# Patient Record
Sex: Female | Born: 1970 | Race: Black or African American | Hispanic: No | Marital: Single | Smoking: Former smoker
Health system: Southern US, Community
[De-identification: ages and names within clinical notes are randomized; demographics above are authoritative.]

## PROBLEM LIST (undated history)

## (undated) DIAGNOSIS — M1990 Unspecified osteoarthritis, unspecified site: Secondary | ICD-10-CM

## (undated) DIAGNOSIS — S0990XA Unspecified injury of head, initial encounter: Secondary | ICD-10-CM

## (undated) DIAGNOSIS — I639 Cerebral infarction, unspecified: Secondary | ICD-10-CM

## (undated) DIAGNOSIS — N946 Dysmenorrhea, unspecified: Secondary | ICD-10-CM

## (undated) DIAGNOSIS — N809 Endometriosis, unspecified: Secondary | ICD-10-CM

## (undated) DIAGNOSIS — K5900 Constipation, unspecified: Secondary | ICD-10-CM

## (undated) DIAGNOSIS — Z90710 Acquired absence of both cervix and uterus: Secondary | ICD-10-CM

## (undated) DIAGNOSIS — F419 Anxiety disorder, unspecified: Secondary | ICD-10-CM

## (undated) DIAGNOSIS — F32A Depression, unspecified: Secondary | ICD-10-CM

## (undated) DIAGNOSIS — F329 Major depressive disorder, single episode, unspecified: Secondary | ICD-10-CM

## (undated) DIAGNOSIS — T7840XA Allergy, unspecified, initial encounter: Secondary | ICD-10-CM

## (undated) DIAGNOSIS — W3400XA Accidental discharge from unspecified firearms or gun, initial encounter: Secondary | ICD-10-CM

## (undated) HISTORY — DX: Unspecified injury of head, initial encounter: S09.90XA

## (undated) HISTORY — PX: NECK SURGERY: SHX720

## (undated) HISTORY — PX: TUBAL LIGATION: SHX77

## (undated) HISTORY — DX: Constipation, unspecified: K59.00

## (undated) HISTORY — DX: Anxiety disorder, unspecified: F41.9

## (undated) HISTORY — DX: Major depressive disorder, single episode, unspecified: F32.9

## (undated) HISTORY — DX: Allergy, unspecified, initial encounter: T78.40XA

## (undated) HISTORY — DX: Depression, unspecified: F32.A

## (undated) HISTORY — PX: KNEE SURGERY: SHX244

---

## 1988-10-05 HISTORY — PX: LIPOMA EXCISION: SHX5283

## 1998-02-28 ENCOUNTER — Other Ambulatory Visit: Admission: RE | Admit: 1998-02-28 | Discharge: 1998-02-28 | Payer: Self-pay | Admitting: *Deleted

## 1998-08-22 ENCOUNTER — Emergency Department (HOSPITAL_COMMUNITY): Admission: EM | Admit: 1998-08-22 | Discharge: 1998-08-22 | Payer: Self-pay

## 1999-04-13 ENCOUNTER — Emergency Department (HOSPITAL_COMMUNITY): Admission: EM | Admit: 1999-04-13 | Discharge: 1999-04-13 | Payer: Self-pay | Admitting: *Deleted

## 2000-04-22 ENCOUNTER — Emergency Department (HOSPITAL_COMMUNITY): Admission: EM | Admit: 2000-04-22 | Discharge: 2000-04-22 | Payer: Self-pay | Admitting: *Deleted

## 2001-10-05 DIAGNOSIS — I639 Cerebral infarction, unspecified: Secondary | ICD-10-CM | POA: Insufficient documentation

## 2001-10-05 DIAGNOSIS — Z5189 Encounter for other specified aftercare: Secondary | ICD-10-CM

## 2001-10-05 DIAGNOSIS — W3400XA Accidental discharge from unspecified firearms or gun, initial encounter: Secondary | ICD-10-CM | POA: Insufficient documentation

## 2001-10-05 HISTORY — DX: Encounter for other specified aftercare: Z51.89

## 2001-10-05 HISTORY — DX: Cerebral infarction, unspecified: I63.9

## 2002-02-25 ENCOUNTER — Encounter: Payer: Self-pay | Admitting: Emergency Medicine

## 2002-02-25 ENCOUNTER — Emergency Department (HOSPITAL_COMMUNITY): Admission: EM | Admit: 2002-02-25 | Discharge: 2002-02-25 | Payer: Self-pay | Admitting: Emergency Medicine

## 2002-02-28 ENCOUNTER — Encounter: Admission: RE | Admit: 2002-02-28 | Discharge: 2002-05-29 | Payer: Self-pay | Admitting: *Deleted

## 2002-05-30 ENCOUNTER — Encounter: Admission: RE | Admit: 2002-05-30 | Discharge: 2002-08-28 | Payer: Self-pay | Admitting: *Deleted

## 2002-10-18 ENCOUNTER — Encounter: Admission: RE | Admit: 2002-10-18 | Discharge: 2002-11-30 | Payer: Self-pay | Admitting: Internal Medicine

## 2003-04-27 ENCOUNTER — Encounter: Payer: Self-pay | Admitting: Internal Medicine

## 2003-04-27 ENCOUNTER — Encounter: Admission: RE | Admit: 2003-04-27 | Discharge: 2003-04-27 | Payer: Self-pay | Admitting: Internal Medicine

## 2004-05-05 ENCOUNTER — Emergency Department (HOSPITAL_COMMUNITY): Admission: EM | Admit: 2004-05-05 | Discharge: 2004-05-05 | Payer: Self-pay | Admitting: Emergency Medicine

## 2006-12-25 ENCOUNTER — Emergency Department (HOSPITAL_COMMUNITY): Admission: EM | Admit: 2006-12-25 | Discharge: 2006-12-25 | Payer: Self-pay | Admitting: Emergency Medicine

## 2006-12-30 ENCOUNTER — Emergency Department (HOSPITAL_COMMUNITY): Admission: EM | Admit: 2006-12-30 | Discharge: 2006-12-30 | Payer: Self-pay | Admitting: Emergency Medicine

## 2007-01-25 ENCOUNTER — Emergency Department (HOSPITAL_COMMUNITY): Admission: EM | Admit: 2007-01-25 | Discharge: 2007-01-25 | Payer: Self-pay | Admitting: Emergency Medicine

## 2007-02-27 ENCOUNTER — Emergency Department (HOSPITAL_COMMUNITY): Admission: EM | Admit: 2007-02-27 | Discharge: 2007-02-27 | Payer: Self-pay | Admitting: Emergency Medicine

## 2007-03-28 ENCOUNTER — Emergency Department (HOSPITAL_COMMUNITY): Admission: EM | Admit: 2007-03-28 | Discharge: 2007-03-28 | Payer: Self-pay | Admitting: Emergency Medicine

## 2007-08-04 ENCOUNTER — Encounter: Admission: RE | Admit: 2007-08-04 | Discharge: 2007-08-04 | Payer: Self-pay | Admitting: Internal Medicine

## 2007-08-17 ENCOUNTER — Encounter: Payer: Self-pay | Admitting: Internal Medicine

## 2007-09-22 ENCOUNTER — Emergency Department (HOSPITAL_COMMUNITY): Admission: EM | Admit: 2007-09-22 | Discharge: 2007-09-22 | Payer: Self-pay | Admitting: Emergency Medicine

## 2007-10-10 ENCOUNTER — Encounter: Admission: RE | Admit: 2007-10-10 | Discharge: 2007-10-10 | Payer: Self-pay | Admitting: Internal Medicine

## 2007-11-11 ENCOUNTER — Ambulatory Visit: Payer: Self-pay | Admitting: Family Medicine

## 2007-11-11 DIAGNOSIS — J329 Chronic sinusitis, unspecified: Secondary | ICD-10-CM | POA: Insufficient documentation

## 2007-11-11 DIAGNOSIS — J3089 Other allergic rhinitis: Secondary | ICD-10-CM

## 2007-11-11 DIAGNOSIS — F329 Major depressive disorder, single episode, unspecified: Secondary | ICD-10-CM | POA: Insufficient documentation

## 2007-11-11 DIAGNOSIS — F411 Generalized anxiety disorder: Secondary | ICD-10-CM | POA: Insufficient documentation

## 2007-11-11 DIAGNOSIS — F32A Depression, unspecified: Secondary | ICD-10-CM | POA: Insufficient documentation

## 2007-11-11 DIAGNOSIS — J018 Other acute sinusitis: Secondary | ICD-10-CM | POA: Insufficient documentation

## 2007-11-11 DIAGNOSIS — J302 Other seasonal allergic rhinitis: Secondary | ICD-10-CM | POA: Insufficient documentation

## 2007-11-14 ENCOUNTER — Telehealth (INDEPENDENT_AMBULATORY_CARE_PROVIDER_SITE_OTHER): Payer: Self-pay | Admitting: *Deleted

## 2007-11-14 ENCOUNTER — Encounter: Payer: Self-pay | Admitting: Family Medicine

## 2008-02-14 ENCOUNTER — Emergency Department (HOSPITAL_COMMUNITY): Admission: EM | Admit: 2008-02-14 | Discharge: 2008-02-14 | Payer: Self-pay | Admitting: Emergency Medicine

## 2008-02-24 ENCOUNTER — Encounter: Admission: RE | Admit: 2008-02-24 | Discharge: 2008-04-02 | Payer: Self-pay | Admitting: Orthopedic Surgery

## 2008-03-26 ENCOUNTER — Encounter: Payer: Self-pay | Admitting: Internal Medicine

## 2008-03-26 ENCOUNTER — Encounter: Admission: RE | Admit: 2008-03-26 | Discharge: 2008-03-26 | Payer: Self-pay | Admitting: Allergy and Immunology

## 2008-05-06 ENCOUNTER — Emergency Department (HOSPITAL_COMMUNITY): Admission: EM | Admit: 2008-05-06 | Discharge: 2008-05-06 | Payer: Self-pay | Admitting: Emergency Medicine

## 2008-08-07 ENCOUNTER — Encounter: Admission: RE | Admit: 2008-08-07 | Discharge: 2008-08-07 | Payer: Self-pay | Admitting: Internal Medicine

## 2008-09-13 ENCOUNTER — Ambulatory Visit: Payer: Self-pay | Admitting: Internal Medicine

## 2008-09-13 LAB — CONVERTED CEMR LAB
Eosinophils Absolute: 0.2 10*3/uL (ref 0.0–0.7)
HCT: 37.7 % (ref 36.0–46.0)
Lymphocytes Relative: 43.8 % (ref 12.0–46.0)
MCHC: 33.7 g/dL (ref 30.0–36.0)
MCV: 85.1 fL (ref 78.0–100.0)
Monocytes Relative: 13.1 % — ABNORMAL HIGH (ref 3.0–12.0)
RBC: 4.42 M/uL (ref 3.87–5.11)

## 2008-12-04 ENCOUNTER — Ambulatory Visit: Payer: Self-pay | Admitting: Internal Medicine

## 2008-12-04 DIAGNOSIS — J310 Chronic rhinitis: Secondary | ICD-10-CM | POA: Insufficient documentation

## 2008-12-27 ENCOUNTER — Telehealth: Payer: Self-pay | Admitting: Internal Medicine

## 2009-01-03 ENCOUNTER — Ambulatory Visit: Payer: Self-pay | Admitting: Internal Medicine

## 2009-01-08 ENCOUNTER — Ambulatory Visit: Payer: Self-pay | Admitting: Internal Medicine

## 2009-01-08 ENCOUNTER — Telehealth (INDEPENDENT_AMBULATORY_CARE_PROVIDER_SITE_OTHER): Payer: Self-pay | Admitting: *Deleted

## 2009-01-10 ENCOUNTER — Ambulatory Visit: Payer: Self-pay | Admitting: Internal Medicine

## 2009-01-14 ENCOUNTER — Ambulatory Visit: Payer: Self-pay | Admitting: Internal Medicine

## 2009-01-14 ENCOUNTER — Telehealth (INDEPENDENT_AMBULATORY_CARE_PROVIDER_SITE_OTHER): Payer: Self-pay | Admitting: *Deleted

## 2009-01-17 ENCOUNTER — Ambulatory Visit: Payer: Self-pay | Admitting: Internal Medicine

## 2009-01-21 ENCOUNTER — Ambulatory Visit: Payer: Self-pay | Admitting: Internal Medicine

## 2009-01-24 ENCOUNTER — Ambulatory Visit: Payer: Self-pay | Admitting: Internal Medicine

## 2009-01-29 ENCOUNTER — Ambulatory Visit: Payer: Self-pay | Admitting: Internal Medicine

## 2009-02-01 ENCOUNTER — Ambulatory Visit: Payer: Self-pay | Admitting: Internal Medicine

## 2009-02-04 ENCOUNTER — Ambulatory Visit: Payer: Self-pay | Admitting: Internal Medicine

## 2009-02-06 ENCOUNTER — Ambulatory Visit: Payer: Self-pay | Admitting: Internal Medicine

## 2009-02-08 ENCOUNTER — Ambulatory Visit: Payer: Self-pay | Admitting: Internal Medicine

## 2009-02-12 ENCOUNTER — Ambulatory Visit: Payer: Self-pay | Admitting: Internal Medicine

## 2009-02-25 ENCOUNTER — Ambulatory Visit: Payer: Self-pay | Admitting: Internal Medicine

## 2009-03-05 ENCOUNTER — Ambulatory Visit: Payer: Self-pay | Admitting: Internal Medicine

## 2009-03-08 ENCOUNTER — Ambulatory Visit: Payer: Self-pay | Admitting: Internal Medicine

## 2009-03-11 ENCOUNTER — Ambulatory Visit: Payer: Self-pay | Admitting: Internal Medicine

## 2009-03-12 ENCOUNTER — Ambulatory Visit: Payer: Self-pay | Admitting: Internal Medicine

## 2009-03-15 ENCOUNTER — Ambulatory Visit: Payer: Self-pay | Admitting: Internal Medicine

## 2009-03-22 ENCOUNTER — Ambulatory Visit: Payer: Self-pay | Admitting: Internal Medicine

## 2009-03-27 ENCOUNTER — Ambulatory Visit: Payer: Self-pay | Admitting: Internal Medicine

## 2009-04-01 ENCOUNTER — Ambulatory Visit: Payer: Self-pay | Admitting: Internal Medicine

## 2009-04-04 ENCOUNTER — Ambulatory Visit: Payer: Self-pay | Admitting: Internal Medicine

## 2009-04-05 ENCOUNTER — Ambulatory Visit: Payer: Self-pay | Admitting: Internal Medicine

## 2009-04-12 ENCOUNTER — Ambulatory Visit: Payer: Self-pay | Admitting: Internal Medicine

## 2009-04-17 ENCOUNTER — Telehealth (INDEPENDENT_AMBULATORY_CARE_PROVIDER_SITE_OTHER): Payer: Self-pay

## 2009-04-17 ENCOUNTER — Ambulatory Visit: Payer: Self-pay | Admitting: Internal Medicine

## 2009-04-19 ENCOUNTER — Ambulatory Visit: Payer: Self-pay | Admitting: Internal Medicine

## 2009-04-23 ENCOUNTER — Ambulatory Visit: Payer: Self-pay | Admitting: Internal Medicine

## 2009-04-26 ENCOUNTER — Ambulatory Visit: Payer: Self-pay | Admitting: Internal Medicine

## 2009-04-29 ENCOUNTER — Ambulatory Visit: Payer: Self-pay | Admitting: Internal Medicine

## 2009-05-01 ENCOUNTER — Telehealth: Payer: Self-pay | Admitting: Internal Medicine

## 2009-05-05 ENCOUNTER — Emergency Department (HOSPITAL_COMMUNITY): Admission: EM | Admit: 2009-05-05 | Discharge: 2009-05-05 | Payer: Self-pay | Admitting: Emergency Medicine

## 2009-05-16 ENCOUNTER — Ambulatory Visit: Payer: Self-pay | Admitting: Internal Medicine

## 2009-05-22 ENCOUNTER — Ambulatory Visit: Payer: Self-pay | Admitting: Internal Medicine

## 2009-05-28 ENCOUNTER — Ambulatory Visit: Payer: Self-pay | Admitting: Internal Medicine

## 2009-06-03 ENCOUNTER — Ambulatory Visit: Payer: Self-pay | Admitting: Internal Medicine

## 2009-06-03 DIAGNOSIS — T6391XA Toxic effect of contact with unspecified venomous animal, accidental (unintentional), initial encounter: Secondary | ICD-10-CM | POA: Insufficient documentation

## 2009-06-11 ENCOUNTER — Ambulatory Visit: Payer: Self-pay | Admitting: Internal Medicine

## 2009-06-17 ENCOUNTER — Ambulatory Visit: Payer: Self-pay | Admitting: Internal Medicine

## 2009-06-26 ENCOUNTER — Ambulatory Visit: Payer: Self-pay | Admitting: Internal Medicine

## 2009-07-02 ENCOUNTER — Ambulatory Visit: Payer: Self-pay | Admitting: Internal Medicine

## 2009-07-12 ENCOUNTER — Telehealth: Payer: Self-pay | Admitting: Internal Medicine

## 2009-07-12 ENCOUNTER — Ambulatory Visit: Payer: Self-pay | Admitting: Internal Medicine

## 2009-07-15 ENCOUNTER — Ambulatory Visit: Payer: Self-pay | Admitting: Internal Medicine

## 2009-07-28 ENCOUNTER — Emergency Department (HOSPITAL_COMMUNITY): Admission: EM | Admit: 2009-07-28 | Discharge: 2009-07-28 | Payer: Self-pay | Admitting: Emergency Medicine

## 2009-07-30 ENCOUNTER — Ambulatory Visit: Payer: Self-pay | Admitting: Internal Medicine

## 2009-08-07 ENCOUNTER — Ambulatory Visit: Payer: Self-pay | Admitting: Internal Medicine

## 2009-08-09 ENCOUNTER — Ambulatory Visit: Payer: Self-pay | Admitting: Internal Medicine

## 2009-08-09 ENCOUNTER — Telehealth: Payer: Self-pay | Admitting: Critical Care Medicine

## 2009-08-12 ENCOUNTER — Ambulatory Visit: Payer: Self-pay | Admitting: Internal Medicine

## 2009-08-20 ENCOUNTER — Ambulatory Visit: Payer: Self-pay | Admitting: Internal Medicine

## 2009-08-26 ENCOUNTER — Ambulatory Visit: Payer: Self-pay | Admitting: Internal Medicine

## 2009-08-26 ENCOUNTER — Encounter: Payer: Self-pay | Admitting: Internal Medicine

## 2009-09-04 ENCOUNTER — Ambulatory Visit: Payer: Self-pay | Admitting: Internal Medicine

## 2009-09-13 ENCOUNTER — Ambulatory Visit: Payer: Self-pay | Admitting: Internal Medicine

## 2009-09-17 ENCOUNTER — Encounter: Admission: RE | Admit: 2009-09-17 | Discharge: 2009-09-17 | Payer: Self-pay | Admitting: Internal Medicine

## 2009-09-20 ENCOUNTER — Ambulatory Visit: Payer: Self-pay | Admitting: Internal Medicine

## 2009-09-23 ENCOUNTER — Ambulatory Visit: Payer: Self-pay | Admitting: Internal Medicine

## 2009-09-30 ENCOUNTER — Ambulatory Visit: Payer: Self-pay | Admitting: Internal Medicine

## 2009-10-09 ENCOUNTER — Encounter: Admission: RE | Admit: 2009-10-09 | Discharge: 2009-10-29 | Payer: Self-pay | Admitting: Family Medicine

## 2009-10-30 ENCOUNTER — Ambulatory Visit: Payer: Self-pay | Admitting: Internal Medicine

## 2009-10-30 ENCOUNTER — Telehealth (INDEPENDENT_AMBULATORY_CARE_PROVIDER_SITE_OTHER): Payer: Self-pay | Admitting: *Deleted

## 2009-11-14 ENCOUNTER — Ambulatory Visit: Payer: Self-pay | Admitting: Internal Medicine

## 2009-11-14 ENCOUNTER — Telehealth (INDEPENDENT_AMBULATORY_CARE_PROVIDER_SITE_OTHER): Payer: Self-pay | Admitting: *Deleted

## 2009-11-25 ENCOUNTER — Ambulatory Visit: Payer: Self-pay | Admitting: Internal Medicine

## 2009-12-02 ENCOUNTER — Ambulatory Visit: Payer: Self-pay | Admitting: Internal Medicine

## 2009-12-13 ENCOUNTER — Telehealth: Payer: Self-pay | Admitting: Internal Medicine

## 2009-12-17 ENCOUNTER — Ambulatory Visit: Payer: Self-pay | Admitting: Internal Medicine

## 2009-12-19 ENCOUNTER — Ambulatory Visit: Payer: Self-pay | Admitting: Internal Medicine

## 2009-12-20 DIAGNOSIS — J209 Acute bronchitis, unspecified: Secondary | ICD-10-CM | POA: Insufficient documentation

## 2010-01-13 ENCOUNTER — Ambulatory Visit: Payer: Self-pay | Admitting: Internal Medicine

## 2010-01-27 ENCOUNTER — Ambulatory Visit: Payer: Self-pay | Admitting: Internal Medicine

## 2010-02-03 ENCOUNTER — Ambulatory Visit: Payer: Self-pay | Admitting: Internal Medicine

## 2010-02-11 ENCOUNTER — Ambulatory Visit: Payer: Self-pay | Admitting: Internal Medicine

## 2010-02-12 ENCOUNTER — Telehealth: Payer: Self-pay | Admitting: Internal Medicine

## 2010-02-27 ENCOUNTER — Ambulatory Visit: Payer: Self-pay | Admitting: Internal Medicine

## 2010-03-04 ENCOUNTER — Emergency Department (HOSPITAL_COMMUNITY): Admission: EM | Admit: 2010-03-04 | Discharge: 2010-03-04 | Payer: Self-pay | Admitting: Family Medicine

## 2010-03-06 ENCOUNTER — Emergency Department (HOSPITAL_COMMUNITY): Admission: EM | Admit: 2010-03-06 | Discharge: 2010-03-06 | Payer: Self-pay | Admitting: Family Medicine

## 2010-03-07 ENCOUNTER — Ambulatory Visit: Payer: Self-pay | Admitting: Internal Medicine

## 2010-03-07 DIAGNOSIS — R21 Rash and other nonspecific skin eruption: Secondary | ICD-10-CM

## 2010-03-31 ENCOUNTER — Ambulatory Visit: Payer: Self-pay | Admitting: Internal Medicine

## 2010-04-15 ENCOUNTER — Telehealth: Payer: Self-pay | Admitting: Internal Medicine

## 2010-05-22 ENCOUNTER — Ambulatory Visit: Payer: Self-pay | Admitting: Internal Medicine

## 2010-05-27 ENCOUNTER — Telehealth: Payer: Self-pay | Admitting: Internal Medicine

## 2010-06-11 ENCOUNTER — Telehealth: Payer: Self-pay | Admitting: Internal Medicine

## 2010-06-19 ENCOUNTER — Telehealth: Payer: Self-pay | Admitting: Internal Medicine

## 2010-06-20 ENCOUNTER — Encounter: Payer: Self-pay | Admitting: Internal Medicine

## 2010-07-07 ENCOUNTER — Telehealth: Payer: Self-pay | Admitting: Internal Medicine

## 2010-07-15 ENCOUNTER — Telehealth: Payer: Self-pay | Admitting: Internal Medicine

## 2010-07-17 ENCOUNTER — Telehealth: Payer: Self-pay | Admitting: Internal Medicine

## 2010-07-18 ENCOUNTER — Telehealth: Payer: Self-pay | Admitting: Internal Medicine

## 2010-07-18 ENCOUNTER — Ambulatory Visit: Payer: Self-pay | Admitting: Internal Medicine

## 2010-07-30 ENCOUNTER — Telehealth (INDEPENDENT_AMBULATORY_CARE_PROVIDER_SITE_OTHER): Payer: Self-pay | Admitting: *Deleted

## 2010-09-15 ENCOUNTER — Telehealth (INDEPENDENT_AMBULATORY_CARE_PROVIDER_SITE_OTHER): Payer: Self-pay | Admitting: *Deleted

## 2010-10-08 ENCOUNTER — Encounter
Admission: RE | Admit: 2010-10-08 | Discharge: 2010-10-08 | Payer: Self-pay | Source: Home / Self Care | Attending: Internal Medicine | Admitting: Internal Medicine

## 2010-10-26 ENCOUNTER — Encounter: Payer: Self-pay | Admitting: Internal Medicine

## 2010-10-26 ENCOUNTER — Encounter: Payer: Self-pay | Admitting: Family Medicine

## 2010-11-06 NOTE — Progress Notes (Signed)
Summary: sample  Phone Note Call from Patient   Caller: Patient Call For: young Summary of Call: can pt get sample of Nasacort.  She is here. Initial call taken by: Eugene Gavia,  October 30, 2009 4:12 PM  Follow-up for Phone Call        pt was given 1 sample of nasacort. Follow-up by: Eugene Gavia,  October 30, 2009 4:14 PM

## 2010-11-06 NOTE — Progress Notes (Signed)
Summary: rx request  Phone Note Call from Patient Call back at Home Phone [phone]   Caller: Patient Call For: young Summary of Call: pt requests refills for xyzal and nasocort. [facility] [address] Florentina Addison told pt to have this msg sent to triage (pt came in our office- she's gone now). call her home # first or cell afterwards [phone] Initial call taken by: Tivis Ringer, CNA,  November 14, 2009 9:01 AM  Follow-up for Phone Call        Spoke with pt and made aware that meds were refilled. Follow-up by: Vernie Murders,  November 14, 2009 9:21 AM    Prescriptions: FLUTICASONE PROPIONATE 50 MCG/ACT SUSP (FLUTICASONE PROPIONATE) 1-2 sprays in each nostril once daily  #1 x 5   Entered by:   Vernie Murders   Authorized by:   Waymon Budge MD   Signed by:   Vernie Murders on 11/14/2009   Method used:   [facility]        [facility]  [address] [phone]* (retail)       [address]       [place], [place]  [address]       Ph: [phone]       Fax: [phone]   RxID:   [id number] XYZAL 5 MG  TABS (LEVOCETIRIZINE DIHYDROCHLORIDE) 1 by mouth once daily  #30 x 5   Entered by:   Vernie Murders   Authorized by:   Waymon Budge MD   Signed by:   Vernie Murders on 11/14/2009   Method used:   [facility]        [facility]  [address] [phone]* (retail)       [address]       [place], [place]  [address]       Ph: [phone]       Fax: [phone]   RxID:   [id number]

## 2010-11-06 NOTE — Progress Notes (Signed)
Summary: prior authorization for xyzal approved x 1 year  Phone Note Call from Patient Call back at Home Phone [phone]   Caller: Patient Call For: Dacota Ruben Summary of Call: Need prior autho to have her xyzal 5mg  filled.[facility] [address] Initial call taken by: Darletta Moll,  July 15, 2010 3:41 PM  Follow-up for Phone Call        lmtcb  Philipp Deputy [facility]  July 15, 2010 5:00 PM   Spoke with pt and she states that she needs a PA on xyzal. I reviewed with her last phone note that states CY cannot do a PA because medicaid will not cover xyzal, that she needs to use OTC meds. PT states she has tried all OTC meds including loratidine and certrizine and they did not work for her. She states she called [facility] and they state since she has tried and failed OTC meds then MD can do PA. Pt gave the # [phone] to call for PA. I advised I will see what I can do.Jennifer Yancey Flemings CMA  July 16, 2010 9:31 AM   Additional Follow-up for Phone Call Additional follow up Details #1::        PT CALLED BACK RE: STATUS OF PRIOR AUTH. Tivis Ringer, CNA  July 17, 2010 9:37 AM  I called [facility]Cs at number given and xyzal  has been approved x 1 year. Pt advised. Carron Curie CMA  July 17, 2010 9:52 AM

## 2010-11-06 NOTE — Miscellaneous (Signed)
Summary: Injection Record / [facility]    Injection Record / [facility]    Imported By: Lennie Odor 06/06/2010 10:38:15  _____________________________________________________________________  External Attachment:    Type:   Image     Comment:   External Document

## 2010-11-06 NOTE — Progress Notes (Signed)
Summary: xyzal  Phone Note Call from Patient   Caller: Patient Call For: Helia Haese Reason for Call: Talk to Nurse Summary of Call: we sent rx for Xyzal over for pt.   She normally gets name brand on this med, because it works better thatn the generic.  Pharmacy stated to pt, we need to specify name brand. Initial call taken by: Eugene Gavia,  December 13, 2009 8:46 AM  Follow-up for Phone Call        rx sent with marked Brand medically necessary. Carron Curie CMA  December 13, 2009 10:20 AM     Prescriptions: XYZAL 5 MG  TABS (LEVOCETIRIZINE DIHYDROCHLORIDE) 1 by mouth once daily Brand medically necessary #30 x 5   Entered by:   Carron Curie CMA   Authorized by:   Waymon Budge MD   Signed by:   Carron Curie CMA on 12/13/2009   Method used:   [facility]        [facility]  [address] [phone]* (retail)       [address]       [place], [place]  [address]       Ph: [phone]       Fax: [phone]   RxID:   [id number]
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
## 2010-11-06 NOTE — Medication Information (Signed)
Summary: Denied/ACS Medicaid Services  Denied/ACS Medicaid Services   Imported By: Lester Arcola 07/15/2010 09:48:26  _____________________________________________________________________  External Attachment:    Type:   Image     Comment:   External Document

## 2010-11-06 NOTE — Miscellaneous (Signed)
Summary: Injection Record/[facility]  Injection Record/[facility]   Imported By: Sherian Rein 02/05/2010 14:34:38  _____________________________________________________________________  External Attachment:    Type:   Image     Comment:   External Document
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
## 2010-11-06 NOTE — Progress Notes (Signed)
Summary: sinus infection-tx with amoxcicillin  Phone Note Call from Patient Call back at Home Phone [phone]   Caller: Patient Call For: young Reason for Call: Acute Illness Summary of Call: Patient says she has a sinus infection.  Requesting antibiotic.  [facility] Initial call taken by: Lehman Prom,  July 30, 2010 9:54 AM  Follow-up for Phone Call        LMTCBx1 to get more symptoms from pt. Carron Curie CMA  July 30, 2010 10:18 AM  Pt returned call. Was disconnected before getting a good number. Zackery Barefoot CMA  July 30, 2010 12:57 PM   Additional Follow-up for Phone Call Additional follow up Details #1::        Spoke with pt.  She is c/o sinus drainage/pressure x 5 days.  She denies any cough but does have alot of throat clearing which she relates to sinus drainage.  Would like something called in.  Pls advise thanks! NKDA Additional Follow-up by: Vernie Murders,  July 30, 2010 1:43 PM    Additional Follow-up for Phone Call Additional follow up Details #2::    amoxacillin 500 mg, # 21, 1 three times a day   Follow-up by: Waymon Budge MD,  July 30, 2010 3:53 PM  Additional Follow-up for Phone Call Additional follow up Details #3:: Details for Additional Follow-up Action Taken: Spoke with pt and advised that rx for amoxcicillin was sent to pharm. Additional Follow-up by: Vernie Murders,  July 30, 2010 4:13 PM  New/Updated Medications: AMOXICILLIN 500 MG CAPS (AMOXICILLIN) 1 three times a day until gone Prescriptions: AMOXICILLIN 500 MG CAPS (AMOXICILLIN) 1 three times a day until gone  #21 x 0   Entered by:   Vernie Murders   Authorized by:   Waymon Budge MD   Signed by:   Vernie Murders on 07/30/2010   Method used:   Electronically to        [facility]  [address] [phone]* (retail)       [address]       [place], [place]  [address]       Ph: [phone] or [phone]       Fax: [phone]   RxID:    [id number]

## 2010-11-06 NOTE — Progress Notes (Signed)
Summary: XYZAL  Phone Note From Pharmacy   [facility]  [address] [phone]* Call For: YOUNG  Summary of Call: PER CALLER PT REQUEST BRAND NAME ONLY FOR XYZAL..... PT HAS MEDICAID PER CALLER PT WILL BE CALLING TO REQUEST THIS MED WHICH WILL REQUIRE PRIOR AUTH AND MUST READ THAT DR YOUNG SAYS THIS IS MEDICALLY NECESSARY..........[facility] [address]  [phone] [place] Initial call taken by: Denna Haggard, CMA,  December 13, 2009 9:14 AM  Follow-up for Phone Call        see prev phone note, rx sent. Carron Curie CMA  December 13, 2009 10:20 AM

## 2010-11-06 NOTE — Progress Notes (Signed)
Summary: nos appt  Phone Note Call from Patient   Caller: [email or web address]  Call For: young Summary of Call: Rsc nos from 9/6 to 9/30 @ 2:45p. Initial call taken by: Darletta Moll,  June 11, 2010 10:43 AM

## 2010-11-06 NOTE — Miscellaneous (Signed)
Summary: Injection Record/[facility]  Injection Record/[facility]   Imported By: Sherian Rein 02/06/2010 14:01:44  _____________________________________________________________________  External Attachment:    Type:   Image     Comment:   External Document

## 2010-11-06 NOTE — Progress Notes (Signed)
Summary: nos appt  Phone Note Call from Patient   Caller: [email or web address]  Call For: Prentice Sackrider Summary of Call: LMTCB x2 to rsc nos from 7/11. Initial call taken by: Darletta Moll,  April 15, 2010 3:16 PM
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
## 2010-11-06 NOTE — Progress Notes (Signed)
Summary: prescript fro xyzal  Phone Note From Pharmacy   Caller: Patient Call For: young [facility]  [address] [phone]* Call For: young  Summary of Call: pt need zyzal prescript called to pharmacy she is waiting Initial call taken by: Rickard Patience,  July 17, 2010 11:34 AM  Follow-up for Phone Call        rx called in. Carron Curie CMA  July 17, 2010 12:12 PM     Prescriptions: XYZAL 5 MG  TABS (LEVOCETIRIZINE DIHYDROCHLORIDE) 1 by mouth once daily  #30 x 6   Entered by:   Carron Curie CMA   Authorized by:   Waymon Budge MD   Signed by:   Carron Curie CMA on 07/17/2010   Method used:   Telephoned to ...       [facility]  [address] [id number] (retail)       [address]       [place], [place]  [address]       Ph: [phone]       Fax: [phone]   RxID:   [id number]

## 2010-11-06 NOTE — Progress Notes (Signed)
Summary: nos appt  Phone Note Call from Patient   Caller: [email or web address]  Call For: Jarry Manon Summary of Call: Rsc nos from 8/22 to 9/6 @ 9a. Initial call taken by: Darletta Moll,  May 27, 2010 11:16 AM

## 2010-11-06 NOTE — Miscellaneous (Signed)
Summary: Injection Orders / [facility]    Injection Orders / [facility]    Imported By: Lennie Odor 03/04/2010 16:14:34  _____________________________________________________________________  External Attachment:    Type:   Image     Comment:   External Document

## 2010-11-06 NOTE — Progress Notes (Signed)
Summary: sinus issues/cb  Phone Note Call from Patient Call back at [phone]   Caller: Patient Call For: young Summary of Call: pt said she has a sinus infection and is wanting [facility] Initial call taken by: Lacinda Axon,  September 15, 2010 5:01 PM  Follow-up for Phone Call        called and spoke with pt and she stated that she has a sinus infection with lots of drainage---cough at times---lots of clearing her throat---requesting abx to be sent in to her pharmacy----NKDA please advise.  [address] [address] [facility]  September 15, 2010 5:22 PM   per dr young ok for liquid per pt's request, augmentin 600mg /22ml 1 tsp three times a day x 7days [facility] [address]--pt aware med called to pharmacy Follow-up by: Philipp Deputy CMA,  September 15, 2010 6:15 PM

## 2010-11-06 NOTE — Progress Notes (Signed)
Summary: nos appt  Phone Note Call from Patient   Caller: [email or web address]  Call For: young Summary of Call: LMTCB x2 to rsc nos from 9/30. Initial call taken by: Darletta Moll,  July 07, 2010 3:50 PM

## 2010-11-06 NOTE — Progress Notes (Signed)
Summary: prescription issues  Phone Note Call from Patient Call back at Home Phone [phone]   Caller: Patient Call For: Christine Cunningham Summary of Call: Pt states she's having problems getting her xyzal from pharmacy, pls advise. Initial call taken by: Darletta Moll,  July 18, 2010 11:35 AM  Follow-up for Phone Call        [facility] Vernie Murders  July 18, 2010 1:18 PM  LMTCBx2. Carron Curie CMA  July 21, 2010 5:12 PM  Spoke to pt pahrmacy and they [id number]. Whenshe tried to get it filled at [address] the PA had not went thru yet so she went to another pharmacy. Nothing further needed.Carron Curie CMA  July 21, 2010 5:15 PM

## 2010-11-06 NOTE — Progress Notes (Signed)
Summary: Generic Xyzal not on formulary  Phone Note From Pharmacy   Call placed by: Michel Bickers CMA,  June 19, 2010 10:15 AM Call placed to: [facility] [phone] Summary of Call: PA initiated for feneric Xyzal.  Patient must first try and fail Loratadine or generic Zyrtec before this medication will be considered for approval. Please advise on alternative therapy for this patient. Initial call taken by: Michel Bickers CMA,  June 19, 2010 10:17 AM [facility]  [address] [id number] Call For: Christine Cunningham  Reason for Call: Medication not on formulary Request: Needs authorization from insurer Summary of Call: I cannot do a prior auth. She should use the loratadine or cetirizine otc anthistamines required by her insurance. Initial call taken by: Waymon Budge MD,  June 23, 2010 8:30 PM  Follow-up for Phone Call        Called, spoke with pt.  Informed her of above.  She states she has tried both of the above meds as recomended by PCP and neither worked.  Pls advise.  Crystal Jones RN  June 24, 2010 9:27 AM   per CY: she will have to stick with OTC because medicaid will not pay for others and OTC meds are just as good. Consider benadryl ot alavert. Carron Curie CMA  June 24, 2010 11:01 AM   Additional Follow-up for Phone Call Additional follow up Details #1::        Called, spoke with pt.  She was informed CY suggest she try OTC benadryl or alavert.  She stated has been on xyzal sine 2004-2005 and this is the only thing that worked.  Stated she has not tried benadryl before but "didn't like putting different stuff in my body" and wanted to know what Dr. Maple Cunningham would do if this medication did not work.  I informed pt he would like for her to try it and if it did not work she should call our office back to let us know this.  Pt then stated she might change doctors because "we didn't do anything for her" and "didn't have time to talk right now" and hung up on me.  Will forward  message back to Dr. Maple Cunningham so he is aware.   Additional Follow-up by: Gweneth Dimitri RN,  June 24, 2010 12:39 PM    Additional Follow-up for Phone Call Additional follow up Details #2::    Noted Follow-up by: Waymon Budge MD,  June 25, 2010 7:52 PM

## 2010-11-06 NOTE — Progress Notes (Signed)
Summary: rx-XYZAL  Phone Note From Pharmacy Call back at [phone] -Jessica   [facility]  [address] [id number] Call For: young  Summary of Call: pt's rx fro Xyzal - needs to be written on rx that brand name medically necessary. Initial call taken by: Eugene Gavia,  December 13, 2009 11:19 AM  Follow-up for Phone Call        I sent rx electronically, but pharmacy states needs to be handwritten and it needs to state "Brand Medically Necessary" written on the RX. Pt does not want generic, states name brand works better for her.  Please advise.Carron Curie CMA  December 13, 2009 12:13 PM   Additional Follow-up for Phone Call Additional follow up Details #1::        Done Additional Follow-up by: Waymon Budge MD,  December 13, 2009 1:38 PM    Additional Follow-up for Phone Call Additional follow up Details #2::    cy gave rx to [facility]. rx faxed. Carron Curie CMA  December 13, 2009 2:06 PM   New/Updated Medications: XYZAL 5 MG  TABS (LEVOCETIRIZINE DIHYDROCHLORIDE) 1 by mouth once daily

## 2010-12-19 ENCOUNTER — Telehealth (INDEPENDENT_AMBULATORY_CARE_PROVIDER_SITE_OTHER): Payer: Self-pay | Admitting: *Deleted

## 2010-12-22 LAB — CULTURE, ROUTINE-ABSCESS: Gram Stain: NONE SEEN

## 2010-12-23 NOTE — Progress Notes (Signed)
Summary: ? sinus infection--amoxicillin sent  Phone Note Call from Patient   Caller: Patient Summary of Call: Pt c/o having a sinus infection. Pt states she is having a cough w/ clear phlem and states it is a lot of phlem coming up, sinus pressure, runny nose, and feels very congested x 1 week. Pt requesting [facility] [address]. Pt states she has had amoxicillin in the past and it has helped. Pt also requesting rx be sent in to help her cough as well. Please Advise thanks.  Allergies No Known Drug Allergies  Carver Fila  December 19, 2010 8:51 AM   Follow-up for Phone Call        Per CDY-okay to give Amoxicillin 500mg  #21 take 1 by mouth three times a day x 7days no refills and Reccommend Delsym OTC sough syrup.Reynaldo Minium CMA  December 19, 2010 12:07 PM   called and informed pt rx was sent and CDY recs. She verbalized understadning Carver Fila  December 19, 2010 12:13 PM     New/Updated Medications: AMOXICILLIN 500 MG CAPS (AMOXICILLIN) take 1 tablet by mouth three times a day x 7 days Prescriptions: AMOXICILLIN 500 MG CAPS (AMOXICILLIN) take 1 tablet by mouth three times a day x 7 days  #21 x 0   Entered by:   Carver Fila   Authorized by:   Waymon Budge MD   Signed by:   Carver Fila on 12/19/2010   Method used:   [facility]ronically to        [facility]  [address] [phone][facility] (retail)       [address]       [place], [place]  [address]       Ph: [phone] or [phone]       Fax: [phone]   RxID:   [id number]

## 2010-12-29 ENCOUNTER — Ambulatory Visit: Payer: Self-pay

## 2011-01-01 ENCOUNTER — Encounter: Payer: Self-pay | Admitting: *Deleted

## 2011-01-05 ENCOUNTER — Encounter: Payer: Self-pay | Admitting: Internal Medicine

## 2011-01-05 ENCOUNTER — Ambulatory Visit (INDEPENDENT_AMBULATORY_CARE_PROVIDER_SITE_OTHER): Payer: Medicaid Other | Admitting: Internal Medicine

## 2011-01-05 VITALS — BP 124/80 | HR 78 | Ht 65.0 in | Wt 172.8 lb

## 2011-01-05 DIAGNOSIS — R21 Rash and other nonspecific skin eruption: Secondary | ICD-10-CM

## 2011-01-05 DIAGNOSIS — S0990XA Unspecified injury of head, initial encounter: Secondary | ICD-10-CM

## 2011-01-05 DIAGNOSIS — J309 Allergic rhinitis, unspecified: Secondary | ICD-10-CM

## 2011-01-05 MED ORDER — MOMETASONE FUROATE 50 MCG/ACT NA SUSP: 2.0000 | Freq: Every day | NASAL | Status: DC

## 2011-01-05 NOTE — Assessment & Plan Note (Addendum)
She can try nasonex while continuing Xyzal. Discussed environmental dust and pollen precautions with print info.  She asked about restarting allergy vaccine- told to stick to basic meds for now.
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
## 2011-01-05 NOTE — Patient Instructions (Signed)
Script for nasonex nose spray- use this every night at bedtime and expect it to take 4-5 days to kick in. Continue it through the pollen seasons.  You can try an otc antihistamine like fexofenadine 180 for sneeze, drainage and itching.

## 2011-01-10 LAB — URINALYSIS, ROUTINE W REFLEX MICROSCOPIC
Glucose, UA: NEGATIVE mg/dL
Hgb urine dipstick: NEGATIVE
Protein, ur: NEGATIVE mg/dL
Urobilinogen, UA: 0.2 mg/dL (ref 0.0–1.0)
pH: 6.5 (ref 5.0–8.0)

## 2011-01-10 LAB — POCT PREGNANCY, URINE: Preg Test, Ur: NEGATIVE

## 2011-01-20 ENCOUNTER — Telehealth: Payer: Self-pay | Admitting: Internal Medicine

## 2011-01-20 MED ORDER — AZELASTINE HCL 0.15 % NA SOLN: NASAL | Status: DC

## 2011-01-20 NOTE — Telephone Encounter (Signed)
While on the phone with pt in ref to her daughter Kerrie Buffalo, pt c/o of sneezing and runny nose and requesting CDY recs on same. Please advise. Thanks. [facility]  NKDA

## 2011-01-20 NOTE — Telephone Encounter (Signed)
Pt aware of RX for Astepro. RX sent to her pharmacy.

## 2011-01-20 NOTE — Telephone Encounter (Signed)
Spoke with CDY-rec Astepro nasal spray.Christine Cunningham

## 2011-01-20 NOTE — Telephone Encounter (Signed)
Pt has been told previously to use Claritin OTC to help with increased pollen at this time; she also has nasal sprays at home she can use.

## 2011-01-20 NOTE — Telephone Encounter (Signed)
lmomtcb x1 

## 2011-02-10 ENCOUNTER — Telehealth: Payer: Self-pay | Admitting: Internal Medicine

## 2011-02-10 NOTE — Telephone Encounter (Signed)
Already taken care of per pt.  

## 2011-02-11 ENCOUNTER — Emergency Department (HOSPITAL_COMMUNITY)
Admission: EM | Admit: 2011-02-11 | Discharge: 2011-02-11 | Disposition: A | Discharge status: Home / Self Care | Payer: Medicaid Other | Attending: Emergency Medicine | Admitting: Emergency Medicine

## 2011-02-11 ENCOUNTER — Emergency Department (HOSPITAL_COMMUNITY): Payer: Medicaid Other

## 2011-02-11 DIAGNOSIS — Y9302 Activity, running: Secondary | ICD-10-CM | POA: Insufficient documentation

## 2011-02-11 DIAGNOSIS — Z8673 Personal history of transient ischemic attack (TIA), and cerebral infarction without residual deficits: Secondary | ICD-10-CM | POA: Insufficient documentation

## 2011-02-11 DIAGNOSIS — X500XXA Overexertion from strenuous movement or load, initial encounter: Secondary | ICD-10-CM | POA: Insufficient documentation

## 2011-02-11 DIAGNOSIS — S82899A Other fracture of unspecified lower leg, initial encounter for closed fracture: Secondary | ICD-10-CM | POA: Insufficient documentation

## 2011-02-11 DIAGNOSIS — M25579 Pain in unspecified ankle and joints of unspecified foot: Secondary | ICD-10-CM | POA: Insufficient documentation

## 2011-02-20 NOTE — Assessment & Plan Note (Signed)
[facility]                                 ON-CALL NOTE   [name], [name]                         MRN:          [id number]  DATE:11/13/2007                            DOB:          [birth year]    TELEPHONE TRIAGE NOTE:  Time:  8:40 a.m.  Caller:  Christine Cunningham  Phone:  [phone]   She sees Dr. Laury Axon.   Patient has a medication question.  She has been on lorazepam for a  while and Dr. Laury Axon wanted her to start Cymbalta.  She wonders how she  should slowly come off of lorazepam while starting Cymbalta.  She has  not started Cymbalta yet.  My advice is to continue her current  medications until tomorrow.  She should ask this question to Dr. Laury Axon  herself for further advice.     Tera Mater. Clent Ridges, MD  Electronically Signed    SAF/MedQ  DD: 11/13/2007  DT: 11/14/2007  Job #: [id number]

## 2011-02-26 ENCOUNTER — Ambulatory Visit: Payer: Medicaid Other | Attending: Family Medicine | Admitting: Rehabilitative and Restorative Service Providers"

## 2011-02-26 DIAGNOSIS — M25579 Pain in unspecified ankle and joints of unspecified foot: Secondary | ICD-10-CM | POA: Insufficient documentation

## 2011-02-26 DIAGNOSIS — IMO0001 Reserved for inherently not codable concepts without codable children: Secondary | ICD-10-CM | POA: Insufficient documentation

## 2011-02-27 ENCOUNTER — Ambulatory Visit: Payer: Medicaid Other | Admitting: Rehabilitative and Restorative Service Providers"

## 2011-03-10 ENCOUNTER — Ambulatory Visit: Payer: Medicaid Other | Attending: Family Medicine | Admitting: Rehabilitative and Restorative Service Providers"

## 2011-03-10 DIAGNOSIS — M25579 Pain in unspecified ankle and joints of unspecified foot: Secondary | ICD-10-CM | POA: Insufficient documentation

## 2011-03-10 DIAGNOSIS — IMO0001 Reserved for inherently not codable concepts without codable children: Secondary | ICD-10-CM | POA: Insufficient documentation

## 2011-03-12 ENCOUNTER — Encounter: Payer: No Typology Code available for payment source | Admitting: Rehabilitative and Restorative Service Providers"

## 2011-03-17 ENCOUNTER — Ambulatory Visit: Payer: Medicaid Other | Admitting: Rehabilitative and Restorative Service Providers"

## 2011-03-19 ENCOUNTER — Ambulatory Visit: Payer: Medicaid Other | Admitting: Rehabilitative and Restorative Service Providers"

## 2011-03-23 ENCOUNTER — Ambulatory Visit: Payer: Medicaid Other | Admitting: Rehabilitative and Restorative Service Providers"

## 2011-03-25 ENCOUNTER — Encounter: Payer: No Typology Code available for payment source | Admitting: Rehabilitative and Restorative Service Providers"

## 2011-04-06 ENCOUNTER — Ambulatory Visit: Payer: Medicaid Other | Attending: Family Medicine | Admitting: Rehabilitative and Restorative Service Providers"

## 2011-04-06 DIAGNOSIS — M25579 Pain in unspecified ankle and joints of unspecified foot: Secondary | ICD-10-CM | POA: Insufficient documentation

## 2011-04-06 DIAGNOSIS — IMO0001 Reserved for inherently not codable concepts without codable children: Secondary | ICD-10-CM | POA: Insufficient documentation

## 2011-04-09 ENCOUNTER — Encounter: Payer: No Typology Code available for payment source | Admitting: Rehabilitative and Restorative Service Providers"

## 2011-04-14 ENCOUNTER — Ambulatory Visit: Payer: Medicaid Other | Admitting: Rehabilitative and Restorative Service Providers"

## 2011-04-20 ENCOUNTER — Ambulatory Visit: Payer: Medicaid Other | Admitting: Physical Therapy

## 2011-04-22 ENCOUNTER — Encounter: Payer: No Typology Code available for payment source | Admitting: Rehabilitative and Restorative Service Providers"

## 2011-04-28 ENCOUNTER — Encounter: Payer: No Typology Code available for payment source | Admitting: Physical Therapy

## 2011-04-28 ENCOUNTER — Ambulatory Visit: Payer: Medicaid Other | Admitting: Rehabilitative and Restorative Service Providers"

## 2011-07-14 ENCOUNTER — Telehealth: Payer: Self-pay | Admitting: Internal Medicine

## 2011-07-14 MED ORDER — CEFDINIR 300 MG PO CAPS: ORAL_CAPSULE | ORAL | Status: DC

## 2011-07-14 MED ORDER — LEVOCETIRIZINE DIHYDROCHLORIDE 5 MG PO TABS: 5.0000 mg | ORAL_TABLET | Freq: Every evening | ORAL | Status: DC

## 2011-07-14 NOTE — Telephone Encounter (Signed)
Spoke with pt and notified rxs for xyzal and cefdinir sent to pharm. Pt verbalized understanding.

## 2011-07-14 NOTE — Telephone Encounter (Signed)
I spoke with pt and she c/o nasal congestion, sinus pressure, sinus headache, PND x couple days. Pt is requesting something called in for her. Pt denies any fever, nausea, vomiting. Please advise recs for pt Dr. Maple Hudson, Thanks  No Known Allergies   Carver Fila, CMA

## 2011-07-14 NOTE — Telephone Encounter (Signed)
Per CY-okay to give Cefdinir 300mg  #14 2 daily no refills and Xyzal refill ok prn.

## 2011-07-22 LAB — URINALYSIS, ROUTINE W REFLEX MICROSCOPIC
Glucose, UA: NEGATIVE
Hgb urine dipstick: NEGATIVE
Ketones, ur: NEGATIVE
Urobilinogen, UA: 0.2
pH: 6.5

## 2011-07-22 LAB — PREGNANCY, URINE: Preg Test, Ur: NEGATIVE

## 2011-08-17 ENCOUNTER — Telehealth: Payer: Self-pay | Admitting: Internal Medicine

## 2011-08-17 DIAGNOSIS — J309 Allergic rhinitis, unspecified: Secondary | ICD-10-CM

## 2011-08-17 NOTE — Telephone Encounter (Signed)
Spoke with pt and she states needs PA for xyzal. We did receive PA request form from [facility]- ATC number provided for PA [phone] and the office was closed (i assume for the holiday)- will call back tomorrow to obtain PA. Pt ID number is [id number].

## 2011-08-18 NOTE — Telephone Encounter (Signed)
PA received and placed on CDY's cart along with this msg.

## 2011-08-18 NOTE — Telephone Encounter (Signed)
Called number provided and was redirected to call [phone]. Was told per rep that the pt's ID number is actually [id number]. I have requested that they fax [place] the form for CDY to fill out and will await fax.

## 2011-08-20 NOTE — Telephone Encounter (Signed)
PA received and form has been placed on cdy cart. Pt also is wanting to know if their is something else she can take in the meantime while we awaiting PA approval. Please advise Dr. Maple Hudson, thanks  No Known Allergies   Carver Fila, CMA

## 2011-08-21 MED ORDER — MOMETASONE FUROATE 50 MCG/ACT NA SUSP: 2.0000 | Freq: Every day | NASAL | Status: DC

## 2011-08-21 NOTE — Telephone Encounter (Signed)
I informed pt of CDY's findings and recommendations. She advised she will try Zyrtec again but has tried and failed all others. Also sent refills of Nasonex per pt's request.   Will hold in Katie's basket awaiting response of PA.

## 2011-08-21 NOTE — Telephone Encounter (Signed)
Please ask her if she remembers why she can't use loratadine, Alavert or Claritin, fexofenadine or Allegra. Zyrtec/ cetirizine is very nearly the same as Xyzal- suggest she take that as needed .

## 2011-08-21 NOTE — Telephone Encounter (Signed)
lmomtcb x1 

## 2011-08-25 NOTE — Telephone Encounter (Signed)
PA info has been faxed back and will await approval or denial; paperwork is in triage in the waiting box.

## 2011-08-31 ENCOUNTER — Telehealth: Payer: Self-pay | Admitting: Internal Medicine

## 2011-08-31 MED ORDER — LEVOCETIRIZINE DIHYDROCHLORIDE 5 MG PO TABS: 5.0000 mg | ORAL_TABLET | Freq: Every evening | ORAL | Status: DC

## 2011-08-31 NOTE — Telephone Encounter (Signed)
I called ins company and they state they did not receive that fax so I did PA over the phone and it was approved from 11--9-12 x 1 year. PT and pahrmacy aware. Carron Curie, CMA

## 2011-08-31 NOTE — Telephone Encounter (Signed)
See phone note from 08-17-11. PA completed and medication approved x 1 year from 11-912. Pt aware and new rx sent. Carron Curie, CMA

## 2011-09-10 ENCOUNTER — Other Ambulatory Visit: Payer: Self-pay | Admitting: Internal Medicine

## 2011-09-10 DIAGNOSIS — Z1231 Encounter for screening mammogram for malignant neoplasm of breast: Secondary | ICD-10-CM

## 2011-09-15 ENCOUNTER — Telehealth: Payer: Self-pay | Admitting: Internal Medicine

## 2011-09-15 NOTE — Telephone Encounter (Signed)
Per CY-decongestant like Mucinex D, saline rinse like Netipot.

## 2011-09-15 NOTE — Telephone Encounter (Signed)
Pt is aware of CDY recs and will call if these do not work for her.

## 2011-09-15 NOTE — Telephone Encounter (Signed)
Pt c/o PND, sinus pressure x 1 week. States completed abx last week. Denies cough, chest tightness, wheezing. Please advise, thanks.  No Known Allergies  [facility]

## 2011-10-12 ENCOUNTER — Ambulatory Visit: Payer: No Typology Code available for payment source

## 2011-11-12 ENCOUNTER — Ambulatory Visit: Payer: No Typology Code available for payment source | Admitting: Internal Medicine

## 2011-11-26 ENCOUNTER — Ambulatory Visit: Payer: No Typology Code available for payment source

## 2011-11-30 ENCOUNTER — Telehealth: Payer: Self-pay | Admitting: Internal Medicine

## 2011-11-30 MED ORDER — DESOXIMETASONE 0.25 % EX CREA: TOPICAL_CREAM | Freq: Two times a day (BID) | CUTANEOUS | Status: DC

## 2011-11-30 NOTE — Telephone Encounter (Signed)
I spoke with the pt and she states she had 2 raised, red bumps on her left shoulder and behind her left ear and now her left ear is swollen and both places are itching. She stated she has not tried anything OTC for this yet, she wants CY recs before she takes anything. Please advise. Carron Curie, CMA No Known Allergies

## 2011-11-30 NOTE — Telephone Encounter (Signed)
Christine Cunningham has arrived in lobby & is requesting to get a cortizone shot-she has broke out unsure why.  Pt isn't wheezing, but has broken out & stated that now her back is itching.  [name]

## 2011-11-30 NOTE — Telephone Encounter (Signed)
Per CDY can take fexofenadine and use topicort steroid cream 0.25%. Pt is aware of this bc she walked in and was informed by TD. I have sent rx for the topicort into the pharmacy. Nothing further was needed

## 2011-12-01 ENCOUNTER — Telehealth: Payer: Self-pay | Admitting: Internal Medicine

## 2011-12-01 NOTE — Telephone Encounter (Signed)
Spoke with pt. She states that the topicort cream requires PA  Called and spoke with pharmacist and she will fax [place] the PA request. Will await fax

## 2011-12-01 NOTE — Telephone Encounter (Signed)
I haven't seen this rash and won't prescribe. I did suggest she try an otc topical steroid such as hydrocortisone.

## 2011-12-01 NOTE — Telephone Encounter (Signed)
[facility]- called to initiate PA at [phone] Was advised that this med is not covered- the preferred meds are flunisolide cream or triamcinolone cream.  Please advise which one to prescribe for her, thanks! No Known Allergies

## 2011-12-01 NOTE — Telephone Encounter (Signed)
Per CY-get Hydrocortisone cream OTC nothing to RX. If no better then pt will get with PCP to have a look at skin area and refer to dermatology if needed.

## 2011-12-08 ENCOUNTER — Telehealth: Payer: Self-pay | Admitting: Pulmonary Disease

## 2011-12-08 MED ORDER — LEVOCETIRIZINE DIHYDROCHLORIDE 5 MG PO TABS: 5.0000 mg | ORAL_TABLET | Freq: Every evening | ORAL | Status: DC

## 2011-12-08 NOTE — Telephone Encounter (Signed)
Spoke with pt. She is requesting 90 day supply xyzal be sent to pharmacy. Rx was sent and I reminded her to keep upcoming appt for future refills. Pt verbalized understanding and states nothing further needed.

## 2011-12-28 ENCOUNTER — Ambulatory Visit
Admission: RE | Admit: 2011-12-28 | Discharge: 2011-12-28 | Disposition: A | Discharge status: Home / Self Care | Payer: Medicaid Other | Source: Ambulatory Visit | Attending: Internal Medicine | Admitting: Internal Medicine

## 2011-12-28 DIAGNOSIS — Z1231 Encounter for screening mammogram for malignant neoplasm of breast: Secondary | ICD-10-CM

## 2012-01-05 ENCOUNTER — Ambulatory Visit: Payer: Medicaid Other | Admitting: Internal Medicine

## 2012-02-18 ENCOUNTER — Ambulatory Visit: Payer: Medicaid Other | Admitting: Internal Medicine

## 2012-03-28 ENCOUNTER — Ambulatory Visit: Payer: Medicaid Other | Admitting: Internal Medicine

## 2012-04-01 ENCOUNTER — Encounter: Payer: Self-pay | Admitting: Internal Medicine

## 2012-04-01 ENCOUNTER — Ambulatory Visit (INDEPENDENT_AMBULATORY_CARE_PROVIDER_SITE_OTHER): Payer: Medicaid Other | Admitting: Internal Medicine

## 2012-04-01 VITALS — BP 116/72 | HR 87 | Ht 65.0 in | Wt 167.0 lb

## 2012-04-01 DIAGNOSIS — J018 Other acute sinusitis: Secondary | ICD-10-CM
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
## 2012-04-01 NOTE — Patient Instructions (Addendum)
Sample Dymista nasal spray   1- 2 puffs each nostril every night at bedtime.

## 2012-04-05 NOTE — Assessment & Plan Note (Signed)
Acute exacerbation of rhinitis/sinusitis. I doubt bacterial infection. Plan-Sample Dymista

## 2012-05-02 ENCOUNTER — Telehealth: Payer: Self-pay | Admitting: Internal Medicine

## 2012-05-02 MED ORDER — AZITHROMYCIN 250 MG PO TABS: ORAL_TABLET | ORAL | Status: DC

## 2012-05-02 NOTE — Telephone Encounter (Signed)
Per CY-okay to give Zpak #1 take as directed.

## 2012-05-02 NOTE — Telephone Encounter (Signed)
I spoke with pt and she states she is beginning to get a sinus infection-C/O sinus HA, sinus pressure, dry cough but phlem feels stuck in her throat x Saturday. Denies any f/c/s/n/v. Pt is requesting to have something called in. Please advise Dr. Maple Hudson thanks  No Known Allergies

## 2012-05-02 NOTE — Telephone Encounter (Signed)
Pt aware of CDY RECS AND rx has been sent to the pharmacy. She voiced her understanding and had no questions

## 2012-05-12 ENCOUNTER — Ambulatory Visit: Payer: Medicaid Other | Admitting: Internal Medicine

## 2012-06-02 ENCOUNTER — Telehealth: Payer: Self-pay | Admitting: Internal Medicine

## 2012-06-02 NOTE — Telephone Encounter (Signed)
ATC but call was disconnected. WCB Carron Curie, CMA

## 2012-06-03 ENCOUNTER — Telehealth: Payer: Self-pay | Admitting: Internal Medicine

## 2012-06-03 ENCOUNTER — Other Ambulatory Visit: Payer: Self-pay | Admitting: Internal Medicine

## 2012-06-03 MED ORDER — LEVOCETIRIZINE DIHYDROCHLORIDE 5 MG PO TABS: 5.0000 mg | ORAL_TABLET | Freq: Every evening | ORAL | Status: DC

## 2012-06-03 MED ORDER — AZITHROMYCIN 250 MG PO TABS: ORAL_TABLET | ORAL | Status: DC

## 2012-06-03 NOTE — Telephone Encounter (Signed)
Contacted patient. Patient last seen 6/28. Sent in refill and patient aware.

## 2012-06-03 NOTE — Telephone Encounter (Signed)
Called, spoke with pt.  Requesting rx for zyxal 5 mg and zpak.  Advised zyxal sent but would need approval for zpak from Dr. Maple Hudson.  Pt c/o sinus pressure, nasal congestion with clear mucus, PND, and coughing "off and on" with clear mucus.  Symptoms started this past weekend.  No f/c/s.  Dr. Maple Hudson, pls advise.  Thank you.  nkda - verified  [facility]

## 2012-06-03 NOTE — Telephone Encounter (Signed)
Z pak rx sent to pharm.  Pt aware.

## 2012-06-03 NOTE — Telephone Encounter (Signed)
Per CY okay to give Zpak #1 take as directed no refills. 

## 2012-06-30 DIAGNOSIS — M25571 Pain in right ankle and joints of right foot: Secondary | ICD-10-CM | POA: Insufficient documentation

## 2012-08-22 ENCOUNTER — Telehealth: Payer: Self-pay | Admitting: Internal Medicine

## 2012-08-22 MED ORDER — AMOXICILLIN 500 MG PO CAPS: 500.0000 mg | ORAL_CAPSULE | Freq: Three times a day (TID) | ORAL | Status: DC

## 2012-08-22 NOTE — Telephone Encounter (Signed)
Per CY-okay to give Amoxicillin 500 mg #21 take 1 po TID no refills.  

## 2012-08-22 NOTE — Telephone Encounter (Signed)
I spoke with pt and is aware of CDY recs. rx has been sent to the pharmacy. 

## 2012-08-22 NOTE — Telephone Encounter (Signed)
I spoke with pt and she believes she has a sinus infection. C/o sinus pressure, nasal congestion, clowing out clear phlem, HA, PND x 1 week. She has been using nasal spray. She is requesting to have something called in for her. Please advise Dr. Maple Hudson thanks  No Known Allergies   Last OV 04/01/12 Pending 04/03/13

## 2012-09-08 ENCOUNTER — Emergency Department (HOSPITAL_COMMUNITY): Payer: Medicaid Other

## 2012-09-08 ENCOUNTER — Emergency Department (HOSPITAL_COMMUNITY)
Admission: EM | Admit: 2012-09-08 | Discharge: 2012-09-08 | Disposition: A | Discharge status: Home / Self Care | Payer: Medicaid Other | Attending: Emergency Medicine | Admitting: Emergency Medicine

## 2012-09-08 DIAGNOSIS — Z79899 Other long term (current) drug therapy: Secondary | ICD-10-CM | POA: Insufficient documentation

## 2012-09-08 DIAGNOSIS — F411 Generalized anxiety disorder: Secondary | ICD-10-CM | POA: Insufficient documentation

## 2012-09-08 DIAGNOSIS — Y92009 Unspecified place in unspecified non-institutional (private) residence as the place of occurrence of the external cause: Secondary | ICD-10-CM | POA: Insufficient documentation

## 2012-09-08 DIAGNOSIS — S8000XA Contusion of unspecified knee, initial encounter: Secondary | ICD-10-CM | POA: Insufficient documentation

## 2012-09-08 DIAGNOSIS — W010XXA Fall on same level from slipping, tripping and stumbling without subsequent striking against object, initial encounter: Secondary | ICD-10-CM | POA: Insufficient documentation

## 2012-09-08 DIAGNOSIS — Z9889 Other specified postprocedural states: Secondary | ICD-10-CM | POA: Insufficient documentation

## 2012-09-08 DIAGNOSIS — Y9301 Activity, walking, marching and hiking: Secondary | ICD-10-CM | POA: Insufficient documentation

## 2012-09-08 DIAGNOSIS — S92109A Unspecified fracture of unspecified talus, initial encounter for closed fracture: Secondary | ICD-10-CM | POA: Insufficient documentation

## 2012-09-08 DIAGNOSIS — F329 Major depressive disorder, single episode, unspecified: Secondary | ICD-10-CM | POA: Insufficient documentation

## 2012-09-08 DIAGNOSIS — S93402A Sprain of unspecified ligament of left ankle, initial encounter: Secondary | ICD-10-CM

## 2012-09-08 DIAGNOSIS — S92153A Displaced avulsion fracture (chip fracture) of unspecified talus, initial encounter for closed fracture: Secondary | ICD-10-CM

## 2012-09-08 DIAGNOSIS — F3289 Other specified depressive episodes: Secondary | ICD-10-CM | POA: Insufficient documentation

## 2012-09-08 DIAGNOSIS — Z87828 Personal history of other (healed) physical injury and trauma: Secondary | ICD-10-CM | POA: Insufficient documentation

## 2012-09-08 DIAGNOSIS — S93409A Sprain of unspecified ligament of unspecified ankle, initial encounter: Secondary | ICD-10-CM | POA: Insufficient documentation

## 2012-09-08 DIAGNOSIS — S8001XA Contusion of right knee, initial encounter: Secondary | ICD-10-CM

## 2012-09-08 MED ORDER — IBUPROFEN 800 MG PO TABS
800.0000 mg | ORAL_TABLET | Freq: Once | ORAL | Status: AC
  Administered 2012-09-08: 800 mg via ORAL
  Filled 2012-09-08: qty 1

## 2012-09-08 MED ORDER — IBUPROFEN 800 MG PO TABS: 800.0000 mg | ORAL_TABLET | Freq: Once | ORAL | Status: DC

## 2012-09-08 MED ORDER — IBUPROFEN 800 MG PO TABS: 800.0000 mg | ORAL_TABLET | Freq: Four times a day (QID) | ORAL | Status: DC | PRN

## 2012-09-08 NOTE — ED Notes (Signed)
Pt states that she stepped off her steps wrong this am and has pain and swelling to lt ankle.
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
## 2012-09-13 ENCOUNTER — Telehealth: Payer: Self-pay | Admitting: Internal Medicine

## 2012-09-13 NOTE — Telephone Encounter (Signed)
[facility]ed [facility] be [facility]ed [facility].

## 2012-09-13 NOTE — ED Provider Notes (Signed)
Medical screening examination/treatment/procedure(s) were performed by non-physician practitioner and as supervising physician I was immediately available for consultation/collaboration.  Marwan T Powers, MD 09/13/12 0806 

## 2012-09-14 NOTE — Telephone Encounter (Signed)
Received PA xyxal 5 mg called [phone] [facility] medicaid # [id number] Authorization code [id number]. This is being processed. Will forward to Katie to f/u on.

## 2012-09-23 NOTE — Telephone Encounter (Signed)
Katie please advise if you have received anything on pt. thanks

## 2012-10-03 ENCOUNTER — Ambulatory Visit: Payer: Medicaid Other | Attending: Orthopaedic Surgery | Admitting: Physical Therapy

## 2012-10-03 NOTE — Telephone Encounter (Signed)
I have not gotten any approval or denial papers on this patient. I am unable to speak well today(losing voice); please call to see the decision on patient. Thanks.

## 2012-10-03 NOTE — Telephone Encounter (Signed)
Per rep at [facility], Xyzal has been APPROVED from 09/14/12 through 09/09/13. Pt and pharmacy have both been notified.

## 2012-10-17 ENCOUNTER — Telehealth: Payer: Self-pay | Admitting: Internal Medicine

## 2012-10-17 NOTE — Telephone Encounter (Signed)
lmomtcb x1 for pt 

## 2012-10-18 NOTE — Telephone Encounter (Signed)
LMTCB x 1 

## 2012-10-19 NOTE — Telephone Encounter (Signed)
LMTCBx3. Jennifer Castillo, CMA  

## 2012-10-21 MED ORDER — AZELASTINE HCL 0.15 % NA SOLN: NASAL | Status: DC

## 2012-10-21 MED ORDER — FLUTICASONE FUROATE 27.5 MCG/SPRAY NA SUSP: 2.0000 | Freq: Every day | NASAL | Status: DC

## 2012-10-21 NOTE — Telephone Encounter (Signed)
Last seen 04/01/12 with follow-up in 1 year. Pt saw Dr. Concepcion Elk on Mon., 10/17/12 and was started on Augmentin 875mg  BID x 10 days and says she is getting better. She would like refills on her nasal sprays and recs from CY for her watery eyes. CY, pls advise.No Known Allergies

## 2012-10-21 NOTE — Telephone Encounter (Signed)
Per CY-okay to refill Azelastine and Fluticasone(Veramyst)x 1 year; "ask pharmacy for bland eye ointment like lacri-lube".

## 2012-10-21 NOTE — Telephone Encounter (Signed)
Called and spoke with pt and she is aware that the 2 nasal sprays have been sent to her pharmacy and pt requested these for 90 day supply.  Pt is aware of eye ointment that CY recs.  Nothing further is needed.

## 2012-10-24 MED ORDER — FLUTICASONE PROPIONATE 50 MCG/ACT NA SUSP: 2.0000 | Freq: Every day | NASAL | Status: DC

## 2012-10-24 NOTE — Telephone Encounter (Addendum)
Insurance will not cover generic Fluticasone/Veramyst but will cover generic Fluticasone/Flonase. The Flonase is the second part of Dymista and therefore would be the correct medication. Verbally changed this with the pharmacy and updated pt's medication list. Nothing further is needed.

## 2012-10-24 NOTE — Addendum Note (Signed)
Addended by: Michel Bickers A on: 10/24/2012 03:30 PM   Modules accepted: Orders

## 2012-10-26 ENCOUNTER — Ambulatory Visit: Payer: Medicaid Other | Attending: Orthopaedic Surgery | Admitting: Rehabilitative and Restorative Service Providers"

## 2012-11-03 ENCOUNTER — Ambulatory Visit: Payer: Medicaid Other | Attending: Orthopaedic Surgery | Admitting: Physical Therapy

## 2012-11-03 DIAGNOSIS — M25676 Stiffness of unspecified foot, not elsewhere classified: Secondary | ICD-10-CM | POA: Insufficient documentation

## 2012-11-03 DIAGNOSIS — IMO0001 Reserved for inherently not codable concepts without codable children: Secondary | ICD-10-CM | POA: Insufficient documentation

## 2012-11-03 DIAGNOSIS — M25579 Pain in unspecified ankle and joints of unspecified foot: Secondary | ICD-10-CM | POA: Insufficient documentation

## 2012-11-03 DIAGNOSIS — M25673 Stiffness of unspecified ankle, not elsewhere classified: Secondary | ICD-10-CM | POA: Insufficient documentation

## 2012-11-04 ENCOUNTER — Other Ambulatory Visit: Payer: Self-pay | Admitting: Internal Medicine

## 2012-11-04 DIAGNOSIS — Z1231 Encounter for screening mammogram for malignant neoplasm of breast: Secondary | ICD-10-CM

## 2012-11-10 ENCOUNTER — Ambulatory Visit: Payer: Medicaid Other | Attending: Orthopaedic Surgery | Admitting: Physical Therapy

## 2012-11-10 DIAGNOSIS — M25673 Stiffness of unspecified ankle, not elsewhere classified: Secondary | ICD-10-CM | POA: Insufficient documentation

## 2012-11-10 DIAGNOSIS — M25676 Stiffness of unspecified foot, not elsewhere classified: Secondary | ICD-10-CM | POA: Insufficient documentation

## 2012-11-10 DIAGNOSIS — IMO0001 Reserved for inherently not codable concepts without codable children: Secondary | ICD-10-CM | POA: Insufficient documentation

## 2012-11-10 DIAGNOSIS — M25579 Pain in unspecified ankle and joints of unspecified foot: Secondary | ICD-10-CM | POA: Insufficient documentation

## 2012-11-17 ENCOUNTER — Ambulatory Visit: Payer: Medicaid Other | Admitting: Physical Therapy

## 2012-11-30 ENCOUNTER — Telehealth: Payer: Self-pay | Admitting: Internal Medicine

## 2012-11-30 MED ORDER — AMOXICILLIN-POT CLAVULANATE 875-125 MG PO TABS: 1.0000 | ORAL_TABLET | Freq: Two times a day (BID) | ORAL | Status: DC

## 2012-11-30 NOTE — Telephone Encounter (Signed)
Refills sent. Pt is aware. Jennifer Castillo, CMA  

## 2012-11-30 NOTE — Telephone Encounter (Signed)
Per CY-okay to give Augmentin 875 mg #14 take 1 po bid no refills.  

## 2012-11-30 NOTE — Telephone Encounter (Signed)
Called, spoke with pt.  Believes she has a sinus infection -- c/o head congestion, sinus pressure, and PND x 1 wk.  Denies f/c/s.  Using flonase daily.  Requesting abx.  Dr. Maple Hudson, pls advise.  Thank you.  Last OV with CDY 04/01/12; asked to f/u in 1 yr  [facility]  nkda - verified with pt

## 2012-12-27 ENCOUNTER — Telehealth: Payer: Self-pay | Admitting: Internal Medicine

## 2012-12-27 MED ORDER — AMOXICILLIN 500 MG PO CAPS: 500.0000 mg | ORAL_CAPSULE | Freq: Three times a day (TID) | ORAL | Status: DC

## 2012-12-27 MED ORDER — LEVOCETIRIZINE DIHYDROCHLORIDE 5 MG PO TABS: 5.0000 mg | ORAL_TABLET | Freq: Every day | ORAL | Status: DC

## 2012-12-27 NOTE — Telephone Encounter (Signed)
ATC patient, no answer LMOMTCB 

## 2012-12-27 NOTE — Telephone Encounter (Signed)
Returning call.Christine Cunningham ° °

## 2012-12-27 NOTE — Telephone Encounter (Signed)
Pt aware of RX sent to [facility]. Pt also requested Rx for Xyzal. Both sent.

## 2012-12-27 NOTE — Telephone Encounter (Signed)
Spoke with pt having sinus drainage,productive cough,denies any sob,wheezing fever. Would like [facility].  No Known Allergies Dr Maple Hudson Please advise . Thank you

## 2012-12-27 NOTE — Telephone Encounter (Signed)
Per CY-okay to give Amoxicillin 500 mg #21 take 1 po TID no refills.  

## 2012-12-27 NOTE — Telephone Encounter (Signed)
LMTCB

## 2012-12-28 ENCOUNTER — Ambulatory Visit
Admission: RE | Admit: 2012-12-28 | Discharge: 2012-12-28 | Disposition: A | Discharge status: Home / Self Care | Payer: Medicaid Other | Source: Ambulatory Visit | Attending: Internal Medicine | Admitting: Internal Medicine

## 2012-12-28 DIAGNOSIS — Z1231 Encounter for screening mammogram for malignant neoplasm of breast: Secondary | ICD-10-CM

## 2013-01-26 ENCOUNTER — Telehealth: Payer: Self-pay | Admitting: Internal Medicine

## 2013-01-26 NOTE — Telephone Encounter (Signed)
LMTCB

## 2013-01-26 NOTE — Telephone Encounter (Signed)
Called, spoke with pt.  C/o sneezing, itchy eyes, hoarseness, and PND x the past few days.  No wheezing, chest tightness, or cough.  Requesting rx to be called in -- hasn't tried any OTC meds.  Dr. Maple Hudson, pls advise.    nkda - verified with pt  [facility]  Last OV with CDY: 04/01/12; asked to f/u in 1 yr Pending OV with CDY 04/03/2013

## 2013-01-26 NOTE — Telephone Encounter (Signed)
Pt aware of CY rec's nothing further needed.

## 2013-01-26 NOTE — Telephone Encounter (Signed)
Per CY-Allegra 180 1 daily or Chlortrimeton OTC.

## 2013-01-27 NOTE — Telephone Encounter (Signed)
Per phone msg from 01/26/13:  Ronny Bacon, CMA at 01/26/2013  4:11 PM    Status: Signed                   Per CY-Allegra 180 1 daily or Chlortrimeton OTC.      -------  Called, spoke with pt.  Pt reports the msg was on her VM.  She is now aware of above recs per Dr. Maple Hudson.  She will try allegra 180 qd and will call back if symptoms do not improve or worsen.

## 2013-02-11 ENCOUNTER — Emergency Department (HOSPITAL_COMMUNITY): Payer: Medicaid Other

## 2013-02-11 ENCOUNTER — Encounter (HOSPITAL_COMMUNITY): Payer: Self-pay

## 2013-02-11 ENCOUNTER — Emergency Department (HOSPITAL_COMMUNITY)
Admission: EM | Admit: 2013-02-11 | Discharge: 2013-02-11 | Disposition: A | Discharge status: Home / Self Care | Payer: Medicaid Other | Attending: Emergency Medicine | Admitting: Emergency Medicine

## 2013-02-11 DIAGNOSIS — Y9389 Activity, other specified: Secondary | ICD-10-CM | POA: Insufficient documentation

## 2013-02-11 DIAGNOSIS — S63501A Unspecified sprain of right wrist, initial encounter: Secondary | ICD-10-CM

## 2013-02-11 DIAGNOSIS — S63509A Unspecified sprain of unspecified wrist, initial encounter: Secondary | ICD-10-CM | POA: Insufficient documentation

## 2013-02-11 DIAGNOSIS — Z87891 Personal history of nicotine dependence: Secondary | ICD-10-CM | POA: Insufficient documentation

## 2013-02-11 DIAGNOSIS — F3289 Other specified depressive episodes: Secondary | ICD-10-CM | POA: Insufficient documentation

## 2013-02-11 DIAGNOSIS — Z79899 Other long term (current) drug therapy: Secondary | ICD-10-CM | POA: Insufficient documentation

## 2013-02-11 DIAGNOSIS — Y92009 Unspecified place in unspecified non-institutional (private) residence as the place of occurrence of the external cause: Secondary | ICD-10-CM | POA: Insufficient documentation

## 2013-02-11 DIAGNOSIS — F411 Generalized anxiety disorder: Secondary | ICD-10-CM | POA: Insufficient documentation

## 2013-02-11 DIAGNOSIS — F329 Major depressive disorder, single episode, unspecified: Secondary | ICD-10-CM | POA: Insufficient documentation

## 2013-02-11 DIAGNOSIS — Z8739 Personal history of other diseases of the musculoskeletal system and connective tissue: Secondary | ICD-10-CM | POA: Insufficient documentation

## 2013-02-11 DIAGNOSIS — X58XXXA Exposure to other specified factors, initial encounter: Secondary | ICD-10-CM | POA: Insufficient documentation

## 2013-02-11 MED ORDER — IBUPROFEN 800 MG PO TABS
800.0000 mg | ORAL_TABLET | Freq: Once | ORAL | Status: AC
  Administered 2013-02-11: 800 mg via ORAL
  Filled 2013-02-11: qty 1

## 2013-02-11 NOTE — ED Provider Notes (Signed)
Medical screening examination/treatment/procedure(s) were performed by non-physician practitioner and as supervising physician I was immediately available for consultation/collaboration.   Camreigh Michie, MD 02/11/13 1938 
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
## 2013-02-11 NOTE — ED Notes (Signed)
C/o pain to RT wrist pain x 2 weeks.  Denies injury.  States the pain increases when she twists wrist.

## 2013-04-03 ENCOUNTER — Ambulatory Visit: Payer: Medicaid Other | Admitting: Internal Medicine

## 2013-04-17 ENCOUNTER — Encounter (HOSPITAL_COMMUNITY): Payer: Self-pay | Admitting: Emergency Medicine

## 2013-04-17 ENCOUNTER — Emergency Department (HOSPITAL_COMMUNITY)
Admission: EM | Admit: 2013-04-17 | Discharge: 2013-04-17 | Disposition: A | Discharge status: Home / Self Care | Payer: Medicaid Other | Attending: Emergency Medicine | Admitting: Emergency Medicine

## 2013-04-17 DIAGNOSIS — Z87891 Personal history of nicotine dependence: Secondary | ICD-10-CM | POA: Insufficient documentation

## 2013-04-17 DIAGNOSIS — M79609 Pain in unspecified limb: Secondary | ICD-10-CM | POA: Insufficient documentation

## 2013-04-17 DIAGNOSIS — F411 Generalized anxiety disorder: Secondary | ICD-10-CM | POA: Insufficient documentation

## 2013-04-17 DIAGNOSIS — Z87828 Personal history of other (healed) physical injury and trauma: Secondary | ICD-10-CM | POA: Insufficient documentation

## 2013-04-17 DIAGNOSIS — M79674 Pain in right toe(s): Secondary | ICD-10-CM

## 2013-04-17 DIAGNOSIS — F329 Major depressive disorder, single episode, unspecified: Secondary | ICD-10-CM | POA: Insufficient documentation

## 2013-04-17 DIAGNOSIS — F3289 Other specified depressive episodes: Secondary | ICD-10-CM | POA: Insufficient documentation

## 2013-04-17 DIAGNOSIS — Z79899 Other long term (current) drug therapy: Secondary | ICD-10-CM | POA: Insufficient documentation

## 2013-04-17 MED ORDER — IBUPROFEN 800 MG PO TABS: 800.0000 mg | ORAL_TABLET | Freq: Three times a day (TID) | ORAL | Status: DC

## 2013-04-17 NOTE — ED Notes (Signed)
Tuesday am, hit right little toe on chair, soaked in epsom salt x 1 week, states continues to be swollen, painful and stiff. States unable to wear shoes, minimal swelling noted, no discoloration
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
## 2013-04-18 NOTE — ED Provider Notes (Signed)
Medical screening examination/treatment/procedure(s) were performed by non-physician practitioner and as supervising physician I was immediately available for consultation/collaboration.  Candyce Churn, MD 04/18/13 [phone]

## 2013-05-03 ENCOUNTER — Telehealth: Payer: Self-pay | Admitting: Internal Medicine

## 2013-05-03 MED ORDER — BROMPHENIRAMINE-PHENYLEPHRINE 4-10 MG PO TB12: 1.0000 | ORAL_TABLET | Freq: Two times a day (BID) | ORAL | Status: DC | PRN

## 2013-05-03 NOTE — Telephone Encounter (Signed)
Pt aware that RX has [facility] [address]

## 2013-05-03 NOTE — Telephone Encounter (Signed)
Pt returning call.Christine Cunningham ° °

## 2013-05-03 NOTE — Telephone Encounter (Signed)
OV scheduled for 05-11-13 at 3:15pm already. Spoke with patient-head congestion, sinus drainage into throat-no color. Pt states that OTC doesn't help. Would like something called to pharmacy. States Amoxicillin doesn't work well either.   No Known Allergies   Please advise. Thanks.

## 2013-05-03 NOTE — Telephone Encounter (Signed)
Per CY give 4-10

## 2013-05-03 NOTE — Telephone Encounter (Signed)
Offer Bromfed (Brompheniraimine/ phenylephrine) # 20, 1 bid prn

## 2013-05-03 NOTE — Telephone Encounter (Signed)
CY please advise what strength you want patient to have in tablet form; Offered as 2-5 ,4-10, 6-19, or 6-20. Thanks.

## 2013-05-04 ENCOUNTER — Telehealth: Payer: Self-pay | Admitting: Internal Medicine

## 2013-05-04 MED ORDER — BROMPHENIRAMINE-PHENYLEPHRINE 4-10 MG PO TB12: 1.0000 | ORAL_TABLET | Freq: Two times a day (BID) | ORAL | Status: DC | PRN

## 2013-05-04 NOTE — Telephone Encounter (Signed)
Pt aware.

## 2013-05-04 NOTE — Telephone Encounter (Signed)
i confirmed RX was never received and this has been resent. Called pt and lmtcb x1 to make her aware

## 2013-05-11 ENCOUNTER — Ambulatory Visit (INDEPENDENT_AMBULATORY_CARE_PROVIDER_SITE_OTHER): Payer: Medicaid Other | Admitting: Internal Medicine

## 2013-05-11 ENCOUNTER — Encounter: Payer: Self-pay | Admitting: Internal Medicine

## 2013-05-11 VITALS — BP 100/62 | HR 72 | Ht 62.21 in | Wt 181.0 lb

## 2013-05-11 DIAGNOSIS — J310 Chronic rhinitis: Secondary | ICD-10-CM

## 2013-05-11 MED ORDER — LEVOCETIRIZINE DIHYDROCHLORIDE 5 MG PO TABS: ORAL_TABLET | ORAL | Status: DC

## 2013-05-11 MED ORDER — FLUTICASONE PROPIONATE 50 MCG/ACT NA SUSP: NASAL | Status: DC

## 2013-05-11 NOTE — Patient Instructions (Addendum)
Try otc Biotene for dry mouth feeling, as long as you are sure you are drinking enough fluids  Ok to continue fluticasone nasal spray
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
## 2013-06-13 ENCOUNTER — Telehealth: Payer: Self-pay | Admitting: Internal Medicine

## 2013-06-13 NOTE — Telephone Encounter (Signed)
Spoke with patient, patient requesting refill on Xyzal Rx already sent 05/11/13 w PRN refills Nothing further needed at this time

## 2013-08-14 ENCOUNTER — Ambulatory Visit (INDEPENDENT_AMBULATORY_CARE_PROVIDER_SITE_OTHER): Payer: Medicaid Other

## 2013-08-14 ENCOUNTER — Telehealth: Payer: Self-pay | Admitting: Internal Medicine

## 2013-08-14 DIAGNOSIS — Z23 Encounter for immunization: Secondary | ICD-10-CM

## 2013-08-14 MED ORDER — LEVOCETIRIZINE DIHYDROCHLORIDE 5 MG PO TABS: ORAL_TABLET | ORAL | Status: DC

## 2013-08-14 MED ORDER — AZITHROMYCIN 250 MG PO TABS: 250.0000 mg | ORAL_TABLET | Freq: Every day | ORAL | Status: DC

## 2013-08-14 NOTE — Telephone Encounter (Signed)
lmtcb x1 

## 2013-08-14 NOTE — Telephone Encounter (Signed)
Pt is c/o having a headache, sore throat, PND, and sinus congestion. She is requesting an rx for this. She is also asking for a refill on xyzal. Please advise. Carron Curie, CMA No Known Allergies   Current Outpatient Prescriptions on File Prior to Visit  Medication Sig Dispense Refill  . Brompheniramine-Phenylephrine 4-10 MG TB12 Take 1 tablet by mouth 2 (two) times daily as needed.  20 tablet  0  . fluticasone (FLONASE) 50 MCG/ACT nasal spray 2 sprays each nostril once daily at bedtime  16 g  prn  . fluticasone (VERAMYST) 27.5 MCG/SPRAY nasal spray Place 2 sprays into the nose daily as needed for allergies.      Marland Kitchen ibuprofen (ADVIL,MOTRIN) 800 MG tablet Take 1 tablet (800 mg total) by mouth 3 (three) times daily.  21 tablet  0  . levocetirizine (XYZAL) 5 MG tablet 1 daily as needed -antihistamine  30 tablet  prn  . LORazepam (ATIVAN) 2 MG tablet Take 2 mg by mouth every 6 (six) hours as needed for anxiety.        No current facility-administered medications on file prior to visit.

## 2013-08-14 NOTE — Telephone Encounter (Signed)
Per CY-okay to refill Xyzal and give Zpak #1 take as directed no refills. Pt aware and Rx sent to [facility].

## 2013-09-01 ENCOUNTER — Encounter (HOSPITAL_COMMUNITY): Payer: Self-pay | Admitting: Emergency Medicine

## 2013-09-01 ENCOUNTER — Telehealth: Payer: Self-pay | Admitting: Internal Medicine

## 2013-09-01 ENCOUNTER — Emergency Department (HOSPITAL_COMMUNITY)
Admission: EM | Admit: 2013-09-01 | Discharge: 2013-09-01 | Disposition: A | Discharge status: Home / Self Care | Payer: Medicaid Other | Attending: Emergency Medicine | Admitting: Emergency Medicine

## 2013-09-01 DIAGNOSIS — J329 Chronic sinusitis, unspecified: Secondary | ICD-10-CM

## 2013-09-01 DIAGNOSIS — Z760 Encounter for issue of repeat prescription: Secondary | ICD-10-CM | POA: Insufficient documentation

## 2013-09-01 DIAGNOSIS — F329 Major depressive disorder, single episode, unspecified: Secondary | ICD-10-CM | POA: Insufficient documentation

## 2013-09-01 DIAGNOSIS — F3289 Other specified depressive episodes: Secondary | ICD-10-CM | POA: Insufficient documentation

## 2013-09-01 DIAGNOSIS — Z87828 Personal history of other (healed) physical injury and trauma: Secondary | ICD-10-CM | POA: Insufficient documentation

## 2013-09-01 DIAGNOSIS — Z79899 Other long term (current) drug therapy: Secondary | ICD-10-CM | POA: Insufficient documentation

## 2013-09-01 DIAGNOSIS — IMO0002 Reserved for concepts with insufficient information to code with codable children: Secondary | ICD-10-CM | POA: Insufficient documentation

## 2013-09-01 DIAGNOSIS — F411 Generalized anxiety disorder: Secondary | ICD-10-CM | POA: Insufficient documentation

## 2013-09-01 MED ORDER — LORAZEPAM 2 MG PO TABS: 2.0000 mg | ORAL_TABLET | Freq: Four times a day (QID) | ORAL | Status: DC | PRN

## 2013-09-01 MED ORDER — CEFDINIR 300 MG PO CAPS: 300.0000 mg | ORAL_CAPSULE | Freq: Two times a day (BID) | ORAL | Status: DC

## 2013-09-01 NOTE — Telephone Encounter (Signed)
Spoke with patient-- Per CY give Omnicef 300mg  #14 BID sent to [facility]  Pt states that she went into the ED and saw them for her Sinusitis-they recommended Netti Pot per D/C notes--no meds given Pt also received her Lorazepam refill through them. Pt does not need refill from CY.  Nothing further needed.

## 2013-09-01 NOTE — Telephone Encounter (Signed)
Suggest omnicef 300 mg, # 14, 1 twice daily  Ok refill lorazepam this time only.

## 2013-09-01 NOTE — ED Notes (Signed)
patient states that she is out of her medications. She would like to get a refill to [facility] the weekend

## 2013-09-01 NOTE — Telephone Encounter (Signed)
Pt aware that Christine Cunningham is not in the office today.   Pt states that her sinuses have not improved since last treatment. Pt c/o still having head congestion and PND. Ronny Bacon, CMA at 08/14/2013 12:15 PM     Status: Signed        Per CY-okay to refill Xyzal and give Zpak #1 take as directed no refills. Pt aware and Rx sent to [facility].    Pt requesting another abx.  Pt also states that her PCP office is closed today and she is out of her Lorazepam, would like to know if she can get a one time fill of that so that she does not go without.  No Known Allergies   Medication List       This list is accurate as of: 09/01/13 10:03 AM.  Always use your most recent med list.               azithromycin 250 MG tablet  Commonly known as:  ZITHROMAX  Take 1 tablet (250 mg total) by mouth daily.     Brompheniramine-Phenylephrine 4-10 MG Tb12  Take 1 tablet by mouth 2 (two) times daily as needed.     fluticasone 27.5 MCG/SPRAY nasal spray  Commonly known as:  VERAMYST  Place 2 sprays into the nose daily as needed for allergies.     fluticasone 50 MCG/ACT nasal spray  Commonly known as:  FLONASE  2 sprays each nostril once daily at bedtime     ibuprofen 800 MG tablet  Commonly known as:  ADVIL,MOTRIN  Take 1 tablet (800 mg total) by mouth 3 (three) times daily.     levocetirizine 5 MG tablet  Commonly known as:  XYZAL  1 daily as needed -antihistamine     LORazepam 2 MG tablet  Commonly known as:  ATIVAN  Take 2 mg by mouth every 6 (six) hours as needed for anxiety.       Please advise Dr Maple Hudson thanks.

## 2013-09-01 NOTE — ED Provider Notes (Signed)
Medical screening examination/treatment/procedure(s) were performed by non-physician practitioner and as supervising physician I was immediately available for consultation/collaboration.  Hurman Horn, MD 09/01/13 2004
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
## 2013-09-20 ENCOUNTER — Telehealth: Payer: Self-pay | Admitting: Internal Medicine

## 2013-09-20 NOTE — Telephone Encounter (Addendum)
Called [facility], spoke with pharmacist Dorene Grebe who will fax PA info to the triage fax Will await fax  Left detailed message on named voice mail informing pt on the above and that we will work on this for her once info is received and keep her updated.  Advised pt to please call with any questions/concerns.

## 2013-09-22 ENCOUNTER — Telehealth: Payer: Self-pay | Admitting: Internal Medicine

## 2013-09-22 NOTE — Telephone Encounter (Signed)
Noted  

## 2013-09-22 NOTE — Telephone Encounter (Signed)
Spoke with patient-states that she has had increase itching; used cortisone cream and did not help. Pt denies any read, flared skin, no hives as well; pt has not used anything new, no new foods, or any new medications.  Pt did inform me she has been under more stress with her life and Christmas time. Pt has Lorazepam Rx to use and an OV with her PCP office today at 2pm. Pt will discuss her increased stress with them. Nothing more needed from our office.   Will forward to CY as FYI.

## 2013-09-25 NOTE — Telephone Encounter (Signed)
Katie, have you seen this PA?  I do not see it in triage.  If you have fax, you can bring it to triage for triage to do.  If not, we can call for #.  Thank you.

## 2013-09-26 NOTE — Telephone Encounter (Signed)
Per Christine Cunningham she has the fax and will do this herself. Will forward message to her

## 2013-10-02 NOTE — Telephone Encounter (Signed)
Spoke with patient's pharmacy-states that PA is not needed from [place] as the Rx has already went through and patient has picked up.

## 2013-11-04 ENCOUNTER — Other Ambulatory Visit: Payer: Self-pay | Admitting: Internal Medicine

## 2013-11-06 ENCOUNTER — Telehealth: Payer: Self-pay | Admitting: Internal Medicine

## 2013-11-06 NOTE — Telephone Encounter (Signed)
Spoke with the pt and notified of recs per CDY  She verbalized understanding   

## 2013-11-06 NOTE — Telephone Encounter (Signed)
Called and spoke with pt. She reports she is having nasal congestion, PND, hoarseness, sneezing, runny nose, prod  Cough w/ clear phlem x the weekend. Denies any fever, no chills, no sweats, no body aches. Not using anything OTC. Please advise Dr. Annamaria Boots thanks  No Known Allergies   Current Outpatient Prescriptions on File Prior to Visit  Medication Sig Dispense Refill   Brompheniramine-Phenylephrine 4-10 MG TB12 Take 1 tablet by mouth 2 (two) times daily as needed.  20 tablet  0   cefdinir (OMNICEF) 300 MG capsule Take 1 capsule (300 mg total) by mouth 2 (two) times daily.  14 capsule  0   fluticasone (FLONASE) 50 MCG/ACT nasal spray 2 sprays each nostril once daily at bedtime  16 g  prn   fluticasone (VERAMYST) 27.5 MCG/SPRAY nasal spray Place 2 sprays into the nose daily as needed for allergies.       ibuprofen (ADVIL,MOTRIN) 800 MG tablet Take 1 tablet (800 mg total) by mouth 3 (three) times daily.  21 tablet  0   levocetirizine (XYZAL) 5 MG tablet TAKE 1 TABLET (5 MG TOTAL) BY MOUTH EVERY EVENING.  90 tablet  0   LORazepam (ATIVAN) 2 MG tablet Take 2 mg by mouth every 6 (six) hours as needed for anxiety.        LORazepam (ATIVAN) 2 MG tablet Take 1 tablet (2 mg total) by mouth every 6 (six) hours as needed for anxiety.  20 tablet  0   No current facility-administered medications on file prior to visit.

## 2013-11-06 NOTE — Telephone Encounter (Signed)
Viral syndrome, antibiotic won't help. Try Advil Cold & Sinus or similar otc.

## 2013-12-21 ENCOUNTER — Ambulatory Visit: Payer: Medicaid Other | Attending: Orthopedic Surgery | Admitting: Physical Therapy

## 2013-12-21 DIAGNOSIS — M25569 Pain in unspecified knee: Secondary | ICD-10-CM | POA: Insufficient documentation

## 2013-12-21 DIAGNOSIS — R609 Edema, unspecified: Secondary | ICD-10-CM | POA: Insufficient documentation

## 2013-12-21 DIAGNOSIS — IMO0001 Reserved for inherently not codable concepts without codable children: Secondary | ICD-10-CM | POA: Insufficient documentation

## 2013-12-21 DIAGNOSIS — R5381 Other malaise: Secondary | ICD-10-CM | POA: Insufficient documentation

## 2013-12-21 DIAGNOSIS — R262 Difficulty in walking, not elsewhere classified: Secondary | ICD-10-CM | POA: Insufficient documentation

## 2013-12-25 ENCOUNTER — Other Ambulatory Visit: Payer: Self-pay

## 2013-12-25 DIAGNOSIS — Z1231 Encounter for screening mammogram for malignant neoplasm of breast: Secondary | ICD-10-CM

## 2014-01-03 ENCOUNTER — Ambulatory Visit: Payer: Medicaid Other | Attending: Orthopedic Surgery | Admitting: Physical Therapy

## 2014-01-03 DIAGNOSIS — R609 Edema, unspecified: Secondary | ICD-10-CM | POA: Insufficient documentation

## 2014-01-03 DIAGNOSIS — IMO0001 Reserved for inherently not codable concepts without codable children: Secondary | ICD-10-CM | POA: Insufficient documentation

## 2014-01-03 DIAGNOSIS — R262 Difficulty in walking, not elsewhere classified: Secondary | ICD-10-CM | POA: Insufficient documentation

## 2014-01-03 DIAGNOSIS — R5381 Other malaise: Secondary | ICD-10-CM | POA: Insufficient documentation

## 2014-01-03 DIAGNOSIS — M25569 Pain in unspecified knee: Secondary | ICD-10-CM | POA: Insufficient documentation

## 2014-01-12 ENCOUNTER — Ambulatory Visit
Admission: RE | Admit: 2014-01-12 | Discharge: 2014-01-12 | Disposition: A | Discharge status: Home / Self Care | Payer: Medicaid Other | Source: Ambulatory Visit

## 2014-01-12 DIAGNOSIS — Z1231 Encounter for screening mammogram for malignant neoplasm of breast: Secondary | ICD-10-CM

## 2014-01-18 ENCOUNTER — Encounter: Payer: Self-pay | Admitting: Internal Medicine

## 2014-01-24 ENCOUNTER — Ambulatory Visit: Payer: Medicaid Other | Admitting: Physical Therapy

## 2014-01-29 ENCOUNTER — Encounter: Payer: Medicaid Other | Admitting: Physical Therapy

## 2014-04-21 ENCOUNTER — Other Ambulatory Visit: Payer: Self-pay | Admitting: Internal Medicine

## 2014-04-25 ENCOUNTER — Telehealth: Payer: Self-pay | Admitting: Internal Medicine

## 2014-04-25 NOTE — Telephone Encounter (Signed)
lmtcb x1 

## 2014-04-25 NOTE — Telephone Encounter (Signed)
Pt returned call.  Spoke with patient who reports a sinus infection with head congestion, sinus pressure/tightness, PND x2days.  Pt denies any wheezing, dyspnea, chest tightness, cough, purulent sputum, hemoptysis, f/c/s/n/v/d.  She also mentions "itching all over" her body x1 day but denies any rash, redness, hives.  Pt mentioned she is out of her Xyzal, tho it was refilled on 7.20.15.  Advised pt will call her pharmacy to check on this.  [facility] spoke [facility] Lan[facility] Bal, the refill was received but the Xyzal is requiring a prior authorization.  This has been faxed to triage.    CDY not in the office this afternoon, will forward to doc of the afternoon - Dr Halford Chessman please advise, thank you.  Last ov 8.7.14 [facility] - verified   Medication List       This list is accurate as of: 04/25/14  2:56 PM.  Always use your most recent med list.               Brompheniramine-Phenylephrine 4-10 MG Tb12  Take 1 tablet by mouth 2 (two) times daily as needed.     cefdinir 300 MG capsule  Commonly known as:  OMNICEF  Take 1 capsule (300 mg total) by mouth 2 (two) times daily.     fluticasone 27.5 MCG/SPRAY nasal spray  Commonly known as:  VERAMYST  Place 2 sprays into the nose daily as needed for allergies.     fluticasone 50 MCG/ACT nasal spray  Commonly known as:  FLONASE  2 sprays each nostril once daily at bedtime     ibuprofen 800 MG tablet  Commonly known as:  ADVIL,MOTRIN  Take 1 tablet (800 mg total) by mouth 3 (three) times daily.     levocetirizine 5 MG tablet  Commonly known as:  XYZAL  TAKE 1 TABLET (5 MG TOTAL) BY MOUTH EVERY EVENING.     LORazepam 2 MG tablet  Commonly known as:  ATIVAN  Take 2 mg by mouth every 6 (six) hours as needed for anxiety.     LORazepam 2 MG tablet  Commonly known as:  ATIVAN  Take 1 tablet (2 mg total) by mouth every 6 (six) hours as needed for anxiety.

## 2014-04-25 NOTE — Telephone Encounter (Signed)
lmomtcb x1 

## 2014-04-25 NOTE — Telephone Encounter (Signed)
She should use flonase two spray each nostril daily and nasal irrigation daily before using flonase.  She can use OTC zyrtec 10 daily prn during the day, and benadryl 50 mg nightly prn until her xyzal prior authorization is completed.  She likely has a virus and does not need antibiotics at this time.  Advised her to call back if she develops a fever or worsening of symptoms.     She is also due for follow up visit with Dr. Annamaria Boots.  Please schedule her for next available ROV with Dr. Annamaria Boots >> if he does not have opening within next week, then schedule for [facility].

## 2014-04-26 NOTE — Telephone Encounter (Signed)
I have called and left message for patient to call and speak with me directly.

## 2014-04-26 NOTE — Telephone Encounter (Signed)
Noted  

## 2014-04-26 NOTE — Telephone Encounter (Signed)
Pt refused all treatment offered per Dr Suzy Bouchard that she has "tried it all!" Pt was very rude and raising voice over the phone. Would not let me explain to her all the recommendations per Dr Halford Chessman.  Pt seems very frustrated d/t her ongoing sinus issues.  I offered to speak with Dr Annamaria Boots to get alternative recommendations - pt refused stating that she does not have time for this. Stated she will find another Doctor and hung up the phone.  Will send to Katie/Dr Annamaria Boots as Juluis Rainier that pt refused treatment offered.

## 2014-04-26 NOTE — Telephone Encounter (Signed)
Pt has returned call. °

## 2014-05-11 ENCOUNTER — Ambulatory Visit: Payer: Medicaid Other | Admitting: Internal Medicine

## 2014-08-12 ENCOUNTER — Emergency Department (HOSPITAL_COMMUNITY)
Admission: EM | Admit: 2014-08-12 | Discharge: 2014-08-12 | Disposition: A | Discharge status: Home / Self Care | Payer: Medicaid Other | Attending: Emergency Medicine | Admitting: Emergency Medicine

## 2014-08-12 ENCOUNTER — Encounter (HOSPITAL_COMMUNITY): Payer: Self-pay | Admitting: Emergency Medicine

## 2014-08-12 DIAGNOSIS — F329 Major depressive disorder, single episode, unspecified: Secondary | ICD-10-CM | POA: Insufficient documentation

## 2014-08-12 DIAGNOSIS — B309 Viral conjunctivitis, unspecified: Secondary | ICD-10-CM | POA: Diagnosis not present

## 2014-08-12 DIAGNOSIS — F419 Anxiety disorder, unspecified: Secondary | ICD-10-CM | POA: Insufficient documentation

## 2014-08-12 DIAGNOSIS — Z87891 Personal history of nicotine dependence: Secondary | ICD-10-CM | POA: Diagnosis not present

## 2014-08-12 DIAGNOSIS — H5712 Ocular pain, left eye: Secondary | ICD-10-CM | POA: Diagnosis present

## 2014-08-12 DIAGNOSIS — Z79899 Other long term (current) drug therapy: Secondary | ICD-10-CM | POA: Diagnosis not present

## 2014-08-12 DIAGNOSIS — Z7952 Long term (current) use of systemic steroids: Secondary | ICD-10-CM | POA: Diagnosis not present

## 2014-08-12 DIAGNOSIS — Z87828 Personal history of other (healed) physical injury and trauma: Secondary | ICD-10-CM | POA: Insufficient documentation

## 2014-08-12 MED ORDER — ERYTHROMYCIN 5 MG/GM OP OINT: TOPICAL_OINTMENT | OPHTHALMIC | Status: DC

## 2014-08-12 NOTE — Discharge Instructions (Signed)
Viral Conjunctivitis Conjunctivitis is an irritation (inflammation) of the clear membrane that covers the white part of the eye (the conjunctiva). The irritation can also happen on the underside of the eyelids. Conjunctivitis makes the eye red or pink in color. This is what is commonly known as pink eye. Viral conjunctivitis can spread easily (contagious). CAUSES   Infection from virus on the surface of the eye.  Infection from the irritation or injury of nearby tissues such as the eyelids or cornea.  More serious inflammation or infection on the inside of the eye.  Other eye diseases.  The use of certain eye medications. SYMPTOMS  The normally white color of the eye or the underside of the eyelid is usually pink or red in color. The pink eye is usually associated with irritation, tearing and some sensitivity to light. Viral conjunctivitis is often associated with a clear, watery discharge. If a discharge is present, there may also be some blurred vision in the affected eye. DIAGNOSIS  Conjunctivitis is diagnosed by an eye exam. The eye specialist looks for changes in the surface tissues of the eye which take on changes characteristic of the specific types of conjunctivitis. A sample of any discharge may be collected on a Q-Tip (sterile swap). The sample will be sent to a lab to see whether or not the inflammation is caused by bacterial or viral infection. TREATMENT  Viral conjunctivitis will not respond to medicines that kill germs (antibiotics). Treatment is aimed at stopping a bacterial infection on top of the viral infection. The goal of treatment is to relieve symptoms (such as itching) with antihistamine drops or other eye medications.  HOME CARE INSTRUCTIONS   To ease discomfort, apply a cool, clean wash cloth to your eye for 10 to 20 minutes, 3 to 4 times a day.  Gently wipe away any drainage from the eye with a warm, wet washcloth or a cotton ball.  Wash your hands often with soap  and use paper towels to dry.  Do not share towels or washcloths. This may spread the infection.  Change or wash your pillowcase every day.  You should not use eye make-up until the infection is gone.  Stop using contacts lenses. Ask your eye professional how to sterilize or replace them before using again. This depends on the type of contact lenses used.  Do not touch the edge of the eyelid with the eye drop bottle or ointment tube when applying medications to the affected eye. This will stop you from spreading the infection to the other eye or to others. SEEK IMMEDIATE MEDICAL CARE IF:   The infection has not improved within 3 days of beginning treatment.  A watery discharge from the eye develops.  Pain in the eye increases.  The redness is spreading.  Vision becomes blurred.  An oral temperature above 102 F (38.9 C) develops, or as your caregiver suggests.  Facial pain, redness or swelling develops.  Any problems that may be related to the prescribed medicine develop. MAKE SURE YOU:   Understand these instructions.  Will watch your condition.  Will get help right away if you are not doing well or get worse. Document Released: 09/21/2005 Document Revised: 12/14/2011 Document Reviewed: 05/10/2008 Exit[employer] Patient Information 2015 [employer]. This information is not intended to replace advice given to you by your health care provider. Make sure you discuss any questions you have with your health care provider.  

## 2014-08-12 NOTE — ED Notes (Signed)
Pt states she went to bed last night upset and crying. States she woke up with right eye painful and pinkish.  Pt denies blurred vision or trouble seeing out of it.
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
## 2014-08-27 ENCOUNTER — Encounter: Payer: Self-pay | Admitting: Internal Medicine

## 2014-09-27 ENCOUNTER — Encounter (HOSPITAL_COMMUNITY): Payer: Self-pay | Admitting: Emergency Medicine

## 2014-09-27 ENCOUNTER — Emergency Department (HOSPITAL_COMMUNITY): Payer: Medicaid Other

## 2014-09-27 ENCOUNTER — Emergency Department (HOSPITAL_COMMUNITY)
Admission: EM | Admit: 2014-09-27 | Discharge: 2014-09-27 | Disposition: A | Discharge status: Home / Self Care | Payer: Medicaid Other | Attending: Emergency Medicine | Admitting: Emergency Medicine

## 2014-09-27 DIAGNOSIS — R079 Chest pain, unspecified: Secondary | ICD-10-CM | POA: Insufficient documentation

## 2014-09-27 DIAGNOSIS — F329 Major depressive disorder, single episode, unspecified: Secondary | ICD-10-CM | POA: Diagnosis not present

## 2014-09-27 DIAGNOSIS — Z79899 Other long term (current) drug therapy: Secondary | ICD-10-CM | POA: Insufficient documentation

## 2014-09-27 DIAGNOSIS — F419 Anxiety disorder, unspecified: Secondary | ICD-10-CM | POA: Insufficient documentation

## 2014-09-27 DIAGNOSIS — Z7951 Long term (current) use of inhaled steroids: Secondary | ICD-10-CM | POA: Insufficient documentation

## 2014-09-27 DIAGNOSIS — J159 Unspecified bacterial pneumonia: Secondary | ICD-10-CM | POA: Diagnosis not present

## 2014-09-27 DIAGNOSIS — J189 Pneumonia, unspecified organism: Secondary | ICD-10-CM

## 2014-09-27 DIAGNOSIS — Z87891 Personal history of nicotine dependence: Secondary | ICD-10-CM | POA: Insufficient documentation

## 2014-09-27 DIAGNOSIS — Z87828 Personal history of other (healed) physical injury and trauma: Secondary | ICD-10-CM | POA: Diagnosis not present

## 2014-09-27 DIAGNOSIS — Z8673 Personal history of transient ischemic attack (TIA), and cerebral infarction without residual deficits: Secondary | ICD-10-CM | POA: Diagnosis not present

## 2014-09-27 DIAGNOSIS — Z792 Long term (current) use of antibiotics: Secondary | ICD-10-CM | POA: Diagnosis not present

## 2014-09-27 DIAGNOSIS — Z791 Long term (current) use of non-steroidal anti-inflammatories (NSAID): Secondary | ICD-10-CM | POA: Diagnosis not present

## 2014-09-27 HISTORY — DX: Accidental discharge from unspecified firearms or gun, initial encounter: W34.00XA

## 2014-09-27 HISTORY — DX: Cerebral infarction, unspecified: I63.9

## 2014-09-27 LAB — BASIC METABOLIC PANEL
Anion gap: 8 (ref 5–15)
BUN: 12 mg/dL (ref 6–23)
CO2: 23 mmol/L (ref 19–32)
Calcium: 9.1 mg/dL (ref 8.4–10.5)
Chloride: 106 mEq/L (ref 96–112)
Creatinine, Ser: 0.74 mg/dL (ref 0.50–1.10)
GFR calc Af Amer: >90 mL/min (ref >90)
GFR calc non Af Amer: >90 mL/min (ref >90)
Glucose, Bld: 88 mg/dL (ref 70–99)
Potassium: 3.7 mmol/L (ref 3.5–5.1)
Sodium: 137 mmol/L (ref 135–145)

## 2014-09-27 LAB — CBC
HCT: 35.3 % — ABNORMAL LOW (ref 36.0–46.0)
Hemoglobin: 11.4 g/dL — ABNORMAL LOW (ref 12.0–15.0)
MCH: 27.6 pg (ref 26.0–34.0)
MCHC: 32.3 g/dL (ref 30.0–36.0)
MCV: 85.5 fL (ref 78.0–100.0)
Platelets: 247 10*3/uL (ref 150–400)
RBC: 4.13 MIL/uL (ref 3.87–5.11)
RDW: 15.1 % (ref 11.5–15.5)
WBC: 4.7 10*3/uL (ref 4.0–10.5)

## 2014-09-27 LAB — I-STAT TROPONIN, ED: Troponin i, poc: 0 ng/mL (ref 0.00–0.08)

## 2014-09-27 MED ORDER — KETOROLAC TROMETHAMINE 60 MG/2ML IM SOLN
60.0000 mg | Freq: Once | INTRAMUSCULAR | Status: AC
  Administered 2014-09-27: 60 mg via INTRAMUSCULAR
  Filled 2014-09-27: qty 2

## 2014-09-27 MED ORDER — AZITHROMYCIN 250 MG PO TABS
500.0000 mg | ORAL_TABLET | Freq: Once | ORAL | Status: AC
  Administered 2014-09-27: 500 mg via ORAL
  Filled 2014-09-27: qty 2

## 2014-09-27 MED ORDER — AZITHROMYCIN 250 MG PO TABS: ORAL_TABLET | ORAL | Status: DC

## 2014-09-27 NOTE — Discharge Instructions (Signed)
Return to the emergency room with worsening of symptoms, new symptoms or with symptoms that are concerning , especially chest pain that feels like a pressure, spreads to left arm or jaw, worse with exertion, associated with nausea, vomiting, shortness of breath and/or sweating, fever is not decreased with Tylenol or ibuprofen, coughing up blood, difficulty breathing. Please take all of your antibiotics until finished!   You may develop abdominal discomfort or diarrhea from the antibiotic.  You may help offset this with probiotics which you can buy or get in yogurt. Do not eat  or take the probiotics until 2 hours after your antibiotic.  RICE: Rest, Ice (three cycles of 20 mins on, 66mins off at least twice a day), compression/brace, elevation. Heating pad works well for back pain. Ibuprofen 400mg  (2 tablets 200mg ) every 5-6 hours for 3-5 days Follow up with your primary care provider in 3 days.
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
## 2014-09-27 NOTE — ED Notes (Signed)
Pt states that starting this morning she has had intermittent central chest pain. Pt states that she was moving things around her new apartment yesterday.  Pt denies shob, n/v or radiation with the chest pain.

## 2014-10-31 ENCOUNTER — Encounter (HOSPITAL_COMMUNITY): Payer: Self-pay | Admitting: Emergency Medicine

## 2014-10-31 ENCOUNTER — Emergency Department (HOSPITAL_COMMUNITY)
Admission: EM | Admit: 2014-10-31 | Discharge: 2014-10-31 | Disposition: A | Discharge status: Home / Self Care | Payer: Medicaid Other | Attending: Emergency Medicine | Admitting: Emergency Medicine

## 2014-10-31 ENCOUNTER — Emergency Department (HOSPITAL_COMMUNITY): Payer: Medicaid Other

## 2014-10-31 DIAGNOSIS — Z7951 Long term (current) use of inhaled steroids: Secondary | ICD-10-CM | POA: Insufficient documentation

## 2014-10-31 DIAGNOSIS — R091 Pleurisy: Secondary | ICD-10-CM | POA: Diagnosis not present

## 2014-10-31 DIAGNOSIS — Z8709 Personal history of other diseases of the respiratory system: Secondary | ICD-10-CM | POA: Insufficient documentation

## 2014-10-31 DIAGNOSIS — F329 Major depressive disorder, single episode, unspecified: Secondary | ICD-10-CM | POA: Insufficient documentation

## 2014-10-31 DIAGNOSIS — R079 Chest pain, unspecified: Secondary | ICD-10-CM | POA: Diagnosis present

## 2014-10-31 DIAGNOSIS — Z791 Long term (current) use of non-steroidal anti-inflammatories (NSAID): Secondary | ICD-10-CM | POA: Diagnosis not present

## 2014-10-31 DIAGNOSIS — F419 Anxiety disorder, unspecified: Secondary | ICD-10-CM | POA: Insufficient documentation

## 2014-10-31 DIAGNOSIS — Z79899 Other long term (current) drug therapy: Secondary | ICD-10-CM | POA: Insufficient documentation

## 2014-10-31 DIAGNOSIS — Z87828 Personal history of other (healed) physical injury and trauma: Secondary | ICD-10-CM | POA: Insufficient documentation

## 2014-10-31 DIAGNOSIS — Z8673 Personal history of transient ischemic attack (TIA), and cerebral infarction without residual deficits: Secondary | ICD-10-CM | POA: Insufficient documentation

## 2014-10-31 DIAGNOSIS — Z87891 Personal history of nicotine dependence: Secondary | ICD-10-CM | POA: Diagnosis not present

## 2014-10-31 LAB — COMPREHENSIVE METABOLIC PANEL
ALBUMIN: 3.8 g/dL (ref 3.5–5.2)
ALT: 13 U/L (ref 0–35)
ANION GAP: 10 (ref 5–15)
AST: 16 U/L (ref 0–37)
Alkaline Phosphatase: 67 U/L (ref 39–117)
BUN: 10 mg/dL (ref 6–23)
CO2: 22 mmol/L (ref 19–32)
Calcium: 9.2 mg/dL (ref 8.4–10.5)
Chloride: 105 mmol/L (ref 96–112)
Creatinine, Ser: 0.7 mg/dL (ref 0.50–1.10)
GFR calc Af Amer: >90 mL/min (ref >90)
GFR calc non Af Amer: >90 mL/min (ref >90)
GLUCOSE: 93 mg/dL (ref 70–99)
POTASSIUM: 4.1 mmol/L (ref 3.5–5.1)
Sodium: 137 mmol/L (ref 135–145)
Total Bilirubin: 0.4 mg/dL (ref 0.3–1.2)
Total Protein: 6.6 g/dL (ref 6.0–8.3)

## 2014-10-31 LAB — CBC
HCT: 36.4 % (ref 36.0–46.0)
HEMOGLOBIN: 11.7 g/dL — AB (ref 12.0–15.0)
MCH: 27.7 pg (ref 26.0–34.0)
MCHC: 32.1 g/dL (ref 30.0–36.0)
MCV: 86.1 fL (ref 78.0–100.0)
PLATELETS: 231 10*3/uL (ref 150–400)
RBC: 4.23 MIL/uL (ref 3.87–5.11)
RDW: 15.4 % (ref 11.5–15.5)
WBC: 5.1 10*3/uL (ref 4.0–10.5)

## 2014-10-31 LAB — I-STAT TROPONIN, ED: TROPONIN I, POC: 0 ng/mL (ref 0.00–0.08)

## 2014-10-31 MED ORDER — NAPROXEN 500 MG PO TABS: 500.0000 mg | ORAL_TABLET | Freq: Two times a day (BID) | ORAL | Status: DC
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
## 2014-10-31 NOTE — ED Notes (Addendum)
Pt c/o central chest pain (described as "aching") radiating to right upper back. Was seen Christmas Eve for same situation and was told she had developing PNA. Given an antibiotic and finished treatment. Denies SOB at this time. Hasn't taken any medications today for pain. No other complaints/concerns. Hx stroke.

## 2014-10-31 NOTE — Discharge Instructions (Signed)
Chest Pain (Nonspecific) °It is often hard to give a specific diagnosis for the cause of chest pain. There is always a chance that your pain could be related to something serious, such as a heart attack or a blood clot in the lungs. You need to follow up with your health care provider for further evaluation. °CAUSES  °· Heartburn. °· Pneumonia or bronchitis. °· Anxiety or stress. °· Inflammation around your heart (pericarditis) or lung (pleuritis or pleurisy). °· A blood clot in the lung. °· A collapsed lung (pneumothorax). It can develop suddenly on its own (spontaneous pneumothorax) or from trauma to the chest. °· Shingles infection (herpes zoster virus). °The chest wall is composed of bones, muscles, and cartilage. Any of these can be the source of the pain. °· The bones can be bruised by injury. °· The muscles or cartilage can be strained by coughing or overwork. °· The cartilage can be affected by inflammation and become sore (costochondritis). °DIAGNOSIS  °Lab tests or other studies may be needed to find the cause of your pain. Your health care provider may have you take a test called an ambulatory electrocardiogram (ECG). An ECG records your heartbeat patterns over a 24-hour period. You may also have other tests, such as: °· Transthoracic echocardiogram (TTE). During echocardiography, sound waves are used to evaluate how blood flows through your heart. °· Transesophageal echocardiogram (TEE). °· Cardiac monitoring. This allows your health care provider to monitor your heart rate and rhythm in real time. °· Holter monitor. This is a portable device that records your heartbeat and can help diagnose heart arrhythmias. It allows your health care provider to track your heart activity for several days, if needed. °· Stress tests by exercise or by giving medicine that makes the heart beat faster. °TREATMENT  °· Treatment depends on what may be causing your chest pain. Treatment may include: °¨ Acid blockers for  heartburn. °¨ Anti-inflammatory medicine. °¨ Pain medicine for inflammatory conditions. °¨ Antibiotics if an infection is present. °· You may be advised to change lifestyle habits. This includes stopping smoking and avoiding alcohol, caffeine, and chocolate. °· You may be advised to keep your head raised (elevated) when sleeping. This reduces the chance of acid going backward from your stomach into your esophagus. °Most of the time, nonspecific chest pain will improve within 2-3 days with rest and mild pain medicine.  °HOME CARE INSTRUCTIONS  °· If antibiotics were prescribed, take them as directed. Finish them even if you start to feel better. °· For the next few days, avoid physical activities that bring on chest pain. Continue physical activities as directed. °· Do not use any tobacco products, including cigarettes, chewing tobacco, or electronic cigarettes. °· Avoid drinking alcohol. °· Only take medicine as directed by your health care provider. °· Follow your health care provider's suggestions for further testing if your chest pain does not go away. °· Keep any follow-up appointments you made. If you do not go to an appointment, you could develop lasting (chronic) problems with pain. If there is any problem keeping an appointment, call to reschedule. °SEEK MEDICAL CARE IF:  °· Your chest pain does not go away, even after treatment. °· You have a rash with blisters on your chest. °· You have a fever. °SEEK IMMEDIATE MEDICAL CARE IF:  °· You have increased chest pain or pain that spreads to your arm, neck, jaw, back, or abdomen. °· You have shortness of breath. °· You have an increasing cough, or you cough   up blood.  You have severe back or abdominal pain.  You feel nauseous or vomit.  You have severe weakness.  You faint.  You have chills. This is an emergency. Do not wait to see if the pain will go away. Get medical help at once. Call your local emergency services (911 in [place] Do not drive  yourself to the hospital. MAKE SURE YOU:   Understand these instructions.  Will watch your condition.  Will get help right away if you are not doing well or get worse. Document Released: 07/01/2005 Document Revised: 09/26/2013 Document Reviewed: 04/26/2008 [facility] Patient Information 2015 [place], [place]. This information is not intended to replace advice given to you by your health care provider. Make sure you discuss any questions you have with your health care provider.  Pleurisy Pleurisy is redness, puffiness (swelling), and soreness (inflammation) of the lining of the lungs. It can be hard to breathe and hurt to breathe. Coughing or deep breathing will make it hurt more. It is often caused by an existing infection or disease.  HOME CARE  Only take medicine as told by your doctor.  Only take antibiotic medicine as directed. Make sure to finish it even if you start to feel better. GET HELP RIGHT AWAY IF:   Your lips, fingernails, or toenails are blue or dark.  You cough up blood.  You have a hard time breathing.  Your pain is not controlled with medicine or it lasts for more than 1 week.  Your pain spreads (radiates) into your neck, arms, or jaw.  You are short of breath or wheezing.  You develop a fever, rash, throw up (vomit), or faint. MAKE SURE YOU:   Understand these instructions.  Will watch your condition.  Will get help right away if you are not doing well or get worse. Document Released: 09/03/2008 Document Revised: 05/24/2013 Document Reviewed: 03/05/2013 [facility] Patient Information 2015 [place], [place]. This information is not intended to replace advice given to you by your health care provider. Make sure you discuss any questions you have with your health care provider.

## 2015-01-09 ENCOUNTER — Other Ambulatory Visit: Payer: Self-pay

## 2015-01-09 DIAGNOSIS — Z1231 Encounter for screening mammogram for malignant neoplasm of breast: Secondary | ICD-10-CM

## 2015-02-01 ENCOUNTER — Ambulatory Visit: Payer: Medicaid Other

## 2015-02-04 ENCOUNTER — Ambulatory Visit: Payer: Medicaid Other

## 2015-02-07 ENCOUNTER — Ambulatory Visit: Payer: Medicaid Other

## 2015-02-08 ENCOUNTER — Ambulatory Visit
Admission: RE | Admit: 2015-02-08 | Discharge: 2015-02-08 | Disposition: A | Discharge status: Home / Self Care | Payer: Medicaid Other | Source: Ambulatory Visit

## 2015-02-08 ENCOUNTER — Ambulatory Visit: Payer: Medicaid Other

## 2015-02-08 ENCOUNTER — Encounter (INDEPENDENT_AMBULATORY_CARE_PROVIDER_SITE_OTHER): Payer: Self-pay

## 2015-02-08 DIAGNOSIS — Z1231 Encounter for screening mammogram for malignant neoplasm of breast: Secondary | ICD-10-CM

## 2015-02-15 ENCOUNTER — Ambulatory Visit: Payer: Medicaid Other

## 2015-03-16 ENCOUNTER — Emergency Department (HOSPITAL_COMMUNITY)
Admission: EM | Admit: 2015-03-16 | Discharge: 2015-03-16 | Disposition: A | Discharge status: Home / Self Care | Payer: Medicaid Other | Attending: Emergency Medicine | Admitting: Emergency Medicine

## 2015-03-16 ENCOUNTER — Encounter (HOSPITAL_COMMUNITY): Payer: Self-pay | Admitting: Emergency Medicine

## 2015-03-16 DIAGNOSIS — Z87891 Personal history of nicotine dependence: Secondary | ICD-10-CM | POA: Insufficient documentation

## 2015-03-16 DIAGNOSIS — Z7951 Long term (current) use of inhaled steroids: Secondary | ICD-10-CM | POA: Insufficient documentation

## 2015-03-16 DIAGNOSIS — Z8673 Personal history of transient ischemic attack (TIA), and cerebral infarction without residual deficits: Secondary | ICD-10-CM | POA: Diagnosis not present

## 2015-03-16 DIAGNOSIS — Z79899 Other long term (current) drug therapy: Secondary | ICD-10-CM | POA: Insufficient documentation

## 2015-03-16 DIAGNOSIS — Z87828 Personal history of other (healed) physical injury and trauma: Secondary | ICD-10-CM | POA: Diagnosis not present

## 2015-03-16 DIAGNOSIS — F329 Major depressive disorder, single episode, unspecified: Secondary | ICD-10-CM | POA: Diagnosis not present

## 2015-03-16 DIAGNOSIS — F419 Anxiety disorder, unspecified: Secondary | ICD-10-CM | POA: Insufficient documentation

## 2015-03-16 DIAGNOSIS — Z760 Encounter for issue of repeat prescription: Secondary | ICD-10-CM

## 2015-03-16 MED ORDER — LORAZEPAM 1 MG PO TABS: 1.0000 mg | ORAL_TABLET | Freq: Every day | ORAL | Status: DC
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
## 2015-03-16 NOTE — ED Notes (Signed)
Pt reports out of lorazepam since Thursday; reports unable to get prescription filled from PCP for next 72 hours.

## 2015-03-16 NOTE — Discharge Instructions (Signed)
Medication Refill, Emergency Department °We have refilled your medication today as a courtesy to you. It is best for your medical care, however, to take care of getting refills done through your primary caregiver's office. They have your records and can do a better job of follow-up than we can in the emergency department. °On maintenance medications, we often only prescribe enough medications to get you by until you are able to see your regular caregiver. This is a more expensive way to refill medications. °In the future, please plan for refills so that you will not have to use the emergency department for this. °Thank you for your help. Your help allows us to better take care of the daily emergencies that enter our department. °Document Released: 01/08/2004 Document Revised: 12/14/2011 Document Reviewed: 12/29/2013 °ExitCare® Patient Information ©2015 [employer]. This information is not intended to replace advice given to you by your health care provider. Make sure you discuss any questions you have with your health care provider. ° °

## 2015-05-07 ENCOUNTER — Telehealth: Payer: Self-pay | Admitting: Internal Medicine

## 2015-05-07 NOTE — Telephone Encounter (Signed)
Called and spoke to pt. Pt requesting refill on xyzal. Pt has not been seen since 2014 and states her PCP has been filling the medication. Advised pt to call her PCP and have them to continue filling the med until we see her - appt made with CY on 8.10.16. Also advised pt to let her PCP know about her recent itching. Pt verbalized understanding and denied any further questions or concerns at this time.

## 2015-05-15 ENCOUNTER — Ambulatory Visit: Payer: Medicaid Other | Admitting: Internal Medicine

## 2015-07-04 ENCOUNTER — Ambulatory Visit: Payer: Medicaid Other | Admitting: Internal Medicine

## 2015-09-12 ENCOUNTER — Encounter: Payer: Self-pay | Admitting: Internal Medicine

## 2015-11-05 ENCOUNTER — Telehealth: Payer: Self-pay

## 2015-11-05 DIAGNOSIS — N83202 Unspecified ovarian cyst, left side: Secondary | ICD-10-CM

## 2015-11-05 NOTE — Telephone Encounter (Signed)
Per Dr. Ihor Dow, pt needs a [place] scheduled before GYN appt.  [place] scheduled for February 13th @ 1100.  GYN appt scheduled for 11/28/15 @ 1415.  Called pt and informed pt of US/GYN appts.  Pt stated that she did not have any questions or concerns at this time.

## 2015-11-08 ENCOUNTER — Encounter: Payer: Self-pay | Admitting: *Deleted

## 2015-11-18 ENCOUNTER — Ambulatory Visit (HOSPITAL_COMMUNITY)
Admission: RE | Admit: 2015-11-18 | Discharge: 2015-11-18 | Disposition: A | Discharge status: Home / Self Care | Payer: Medicaid Other | Source: Ambulatory Visit | Attending: Obstetrics & Gynecology | Admitting: Obstetrics & Gynecology

## 2015-11-18 DIAGNOSIS — N83202 Unspecified ovarian cyst, left side: Secondary | ICD-10-CM

## 2015-11-18 DIAGNOSIS — D259 Leiomyoma of uterus, unspecified: Secondary | ICD-10-CM | POA: Insufficient documentation

## 2015-11-18 DIAGNOSIS — R102 Pelvic and perineal pain: Secondary | ICD-10-CM | POA: Diagnosis present

## 2015-11-23 ENCOUNTER — Encounter: Payer: Self-pay | Admitting: *Deleted

## 2015-11-28 ENCOUNTER — Ambulatory Visit (INDEPENDENT_AMBULATORY_CARE_PROVIDER_SITE_OTHER): Payer: Medicaid Other | Admitting: Obstetrics & Gynecology

## 2015-11-28 ENCOUNTER — Encounter: Payer: Self-pay | Admitting: Obstetrics & Gynecology

## 2015-11-28 VITALS — BP 112/77 | HR 82 | Temp 98.6°F | Wt 151.2 lb

## 2015-11-28 DIAGNOSIS — N946 Dysmenorrhea, unspecified: Secondary | ICD-10-CM | POA: Diagnosis not present
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
## 2015-11-28 NOTE — Patient Instructions (Signed)
Uterine Fibroids Uterine fibroids are tissue masses (tumors) that can develop in the womb (uterus). They are also called leiomyomas. This type of tumor is not cancerous (benign) and does not spread to other parts of the body outside of the pelvic area, which is between the hip bones. Occasionally, fibroids may develop in the fallopian tubes, in the cervix, or on the support structures (ligaments) that surround the uterus. You can have one or many fibroids. Fibroids can vary in size, weight, and where they grow in the uterus. Some can become quite large. Most fibroids do not require medical treatment. CAUSES A fibroid can develop when a single uterine cell keeps growing (replicating). Most cells in the human body have a control mechanism that keeps them from replicating without control. SIGNS AND SYMPTOMS Symptoms may include:   Heavy bleeding during your period.  Bleeding or spotting between periods.  Pelvic pain and pressure.  Bladder problems, such as needing to urinate more often (urinary frequency) or urgently.  Inability to reproduce offspring (infertility).  Miscarriages. DIAGNOSIS Uterine fibroids are diagnosed through a physical exam. Your health care provider may feel the lumpy tumors during a pelvic exam. Ultrasonography and an MRI may be done to determine the size, location, and number of fibroids. TREATMENT Treatment may include:  Watchful waiting. This involves getting the fibroid checked by your health care provider to see if it grows or shrinks. Follow your health care provider's recommendations for how often to have this checked.  Hormone medicines. These can be taken by mouth or given through an intrauterine device (IUD).  Surgery.  Removing the fibroids (myomectomy) or the uterus (hysterectomy).  Removing blood supply to the fibroids (uterine artery embolization). If fibroids interfere with your fertility and you want to become pregnant, your health care provider  may recommend having the fibroids removed.  HOME CARE INSTRUCTIONS  Keep all follow-up visits as directed by your health care provider. This is important.  Take medicines only as directed by your health care provider.  If you were prescribed a hormone treatment, take the hormone medicines exactly as directed.  Do not take aspirin, because it can cause bleeding.  Ask your health care provider about taking iron pills and increasing the amount of dark green, leafy vegetables in your diet. These actions can help to boost your blood iron levels, which may be affected by heavy menstrual bleeding.  Pay close attention to your period and tell your health care provider about any changes, such as:  Increased blood flow that requires you to use more pads or tampons than usual per month.  A change in the number of days that your period lasts per month.  A change in symptoms that are associated with your period, such as abdominal cramping or back pain. SEEK MEDICAL CARE IF:  You have pelvic pain, back pain, or abdominal cramps that cannot be controlled with medicines.  You have an increase in bleeding between and during periods.  You soak tampons or pads in a half hour or less.  You feel lightheaded, extra tired, or weak. SEEK IMMEDIATE MEDICAL CARE IF:  You faint.  You have a sudden increase in pelvic pain.   This information is not intended to replace advice given to you by your health care provider. Make sure you discuss any questions you have with your health care provider.   Document Released: 09/18/2000 Document Revised: 10/12/2014 Document Reviewed: 03/20/2014 Elsevier Interactive Patient Education 2016 Elsevier Inc.  

## 2015-12-05 ENCOUNTER — Ambulatory Visit (INDEPENDENT_AMBULATORY_CARE_PROVIDER_SITE_OTHER): Payer: Medicaid Other | Admitting: Internal Medicine

## 2015-12-05 ENCOUNTER — Encounter: Payer: Self-pay | Admitting: Internal Medicine

## 2015-12-05 VITALS — BP 118/80 | HR 72 | Ht 62.0 in | Wt 151.6 lb

## 2015-12-05 DIAGNOSIS — J309 Allergic rhinitis, unspecified: Secondary | ICD-10-CM

## 2015-12-05 DIAGNOSIS — J3089 Other allergic rhinitis: Principal | ICD-10-CM

## 2015-12-05 DIAGNOSIS — J302 Other seasonal allergic rhinitis: Secondary | ICD-10-CM

## 2015-12-05 MED ORDER — METHYLPREDNISOLONE ACETATE 80 MG/ML IJ SUSP
80.0000 mg | Freq: Once | INTRAMUSCULAR | Status: AC
  Administered 2015-12-05: 80 mg via INTRAMUSCULAR

## 2015-12-05 NOTE — Patient Instructions (Addendum)
Depo 80   Dx seasonal and perennial allergic rhinitis  Flu vax  Ok to continue antihistamine like Zyrtec and a nasal spray like flonase  Please call as needed
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
## 2015-12-05 NOTE — Assessment & Plan Note (Signed)
Seasonal allergic exacerbation. We discussed as needed use of OTC antihistamine and nasal steroid spray. Plan-Depo-Medrol

## 2015-12-17 ENCOUNTER — Telehealth: Payer: Self-pay | Admitting: Internal Medicine

## 2015-12-17 MED ORDER — AMOXICILLIN-POT CLAVULANATE 875-125 MG PO TABS
1.0000 | ORAL_TABLET | Freq: Two times a day (BID) | ORAL | Status: DC
Start: 2015-12-17 — End: 2015-12-17

## 2015-12-17 MED ORDER — AMOXICILLIN-POT CLAVULANATE 875-125 MG PO TABS: 1.0000 | ORAL_TABLET | Freq: Two times a day (BID) | ORAL | Status: DC

## 2015-12-17 NOTE — Telephone Encounter (Signed)
[facility]mentin rx sent to [facility] on [address].  Pt would like [facility] [place].  Spoke with Adonis Huguenin with [facility] [address] and cx'd  augmentin rx.  Resent electronically to [place].

## 2015-12-17 NOTE — Addendum Note (Signed)
Addended by: Raymondo Band D on: 12/17/2015 03:40 PM   Modules accepted: Orders
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
## 2015-12-17 NOTE — Telephone Encounter (Signed)
Offer augmentin 875, # 14    1 twice daily x 7 days  Also suggest an otc probiotic like Florastor, or Align, to help her stomach with the antibiotic

## 2015-12-17 NOTE — Telephone Encounter (Signed)
Called, spoke with pt.  Discussed below recs per Dr. Annamaria Boots.  Pt verbalized understanding and is aware [facility] and probiotic is OTC.  Pt is aware to call office back if symptoms do not improve or worsen. She verbalized understanding, is in agreement with plan, and voiced no further questions or concerns at this time.

## 2016-01-17 ENCOUNTER — Other Ambulatory Visit: Payer: Self-pay

## 2016-01-17 DIAGNOSIS — Z1231 Encounter for screening mammogram for malignant neoplasm of breast: Secondary | ICD-10-CM

## 2016-01-21 NOTE — Patient Instructions (Addendum)
Your procedure is scheduled on:  Wednesday, January 29, 2016  Enter through the [employer] [facility] at: 8:15 AM  Pick up the phone at the desk and dial [phone].  Call this number if you have problems the morning of surgery: [phone].  Remember: Do NOT eat food or drink after:  Midnight Tuesday  Take these medicines the morning of surgery with a SIP OF WATER: Ativan if needed  Do NOT wear jewelry (body piercing), metal hair clips/bobby pins, make-up, or nail polish. Do NOT wear lotions, powders, or perfumes.  You may wear deodorant. Do NOT shave for 48 hours prior to surgery. Do NOT bring valuables to the hospital. Contacts, dentures, or bridgework may not be worn into surgery.  Leave suitcase in car.  After surgery it may be brought to your room.  For patients admitted to the hospital, checkout time is 11:00 AM the day of discharge.  BRING A COPY OF HEALTHCARE POWER OF ATTORNEY PAPERWORK DAY OF SURGERY.

## 2016-01-22 ENCOUNTER — Encounter (HOSPITAL_COMMUNITY)
Admission: RE | Admit: 2016-01-22 | Discharge: 2016-01-22 | Disposition: A | Discharge status: Home / Self Care | Payer: Medicaid Other | Source: Ambulatory Visit | Attending: Obstetrics and Gynecology | Admitting: Obstetrics and Gynecology

## 2016-01-22 ENCOUNTER — Encounter (HOSPITAL_COMMUNITY): Payer: Self-pay

## 2016-01-22 DIAGNOSIS — Z8673 Personal history of transient ischemic attack (TIA), and cerebral infarction without residual deficits: Secondary | ICD-10-CM | POA: Diagnosis not present

## 2016-01-22 DIAGNOSIS — N946 Dysmenorrhea, unspecified: Secondary | ICD-10-CM | POA: Diagnosis not present

## 2016-01-22 DIAGNOSIS — F419 Anxiety disorder, unspecified: Secondary | ICD-10-CM | POA: Insufficient documentation

## 2016-01-22 DIAGNOSIS — Z87891 Personal history of nicotine dependence: Secondary | ICD-10-CM | POA: Insufficient documentation

## 2016-01-22 DIAGNOSIS — F329 Major depressive disorder, single episode, unspecified: Secondary | ICD-10-CM | POA: Diagnosis not present

## 2016-01-22 DIAGNOSIS — N920 Excessive and frequent menstruation with regular cycle: Secondary | ICD-10-CM | POA: Insufficient documentation

## 2016-01-22 DIAGNOSIS — Z01812 Encounter for preprocedural laboratory examination: Secondary | ICD-10-CM | POA: Diagnosis present

## 2016-01-22 HISTORY — DX: Unspecified osteoarthritis, unspecified site: M19.90

## 2016-01-22 LAB — TYPE AND SCREEN
ABO/RH(D): AB POS
ANTIBODY SCREEN: NEGATIVE

## 2016-01-22 LAB — CBC
HCT: 37.5 % (ref 36.0–46.0)
Hemoglobin: 12.5 g/dL (ref 12.0–15.0)
MCH: 28.2 pg (ref 26.0–34.0)
MCHC: 33.3 g/dL (ref 30.0–36.0)
MCV: 84.5 fL (ref 78.0–100.0)
Platelets: 286 10*3/uL (ref 150–400)
RBC: 4.44 MIL/uL (ref 3.87–5.11)
RDW: 15.7 % — AB (ref 11.5–15.5)
WBC: 5.2 10*3/uL (ref 4.0–10.5)

## 2016-01-22 LAB — ABO/RH: ABO/RH(D): AB POS
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
## 2016-01-28 ENCOUNTER — Encounter (HOSPITAL_COMMUNITY): Payer: Self-pay | Admitting: Obstetrics and Gynecology

## 2016-01-28 DIAGNOSIS — N809 Endometriosis, unspecified: Secondary | ICD-10-CM

## 2016-01-28 DIAGNOSIS — N8000 Endometriosis of the uterus, unspecified: Secondary | ICD-10-CM | POA: Diagnosis present

## 2016-01-28 DIAGNOSIS — N946 Dysmenorrhea, unspecified: Secondary | ICD-10-CM

## 2016-01-28 DIAGNOSIS — N8003 Adenomyosis of the uterus: Secondary | ICD-10-CM | POA: Diagnosis present

## 2016-01-28 HISTORY — DX: Dysmenorrhea, unspecified: N94.6
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
## 2016-01-29 ENCOUNTER — Ambulatory Visit (HOSPITAL_COMMUNITY): Payer: Medicaid Other | Admitting: Anesthesiology

## 2016-01-29 ENCOUNTER — Observation Stay (HOSPITAL_COMMUNITY)
Admission: RE | Admit: 2016-01-29 | Discharge: 2016-01-30 | Disposition: A | Discharge status: Home / Self Care | Payer: Medicaid Other | Source: Ambulatory Visit | Attending: Obstetrics and Gynecology | Admitting: Obstetrics and Gynecology

## 2016-01-29 ENCOUNTER — Encounter (HOSPITAL_COMMUNITY)
Admission: RE | Disposition: A | Discharge status: Home / Self Care | Payer: Self-pay | Source: Ambulatory Visit | Attending: Obstetrics and Gynecology

## 2016-01-29 ENCOUNTER — Encounter (HOSPITAL_COMMUNITY): Payer: Self-pay | Admitting: Obstetrics and Gynecology

## 2016-01-29 DIAGNOSIS — N888 Other specified noninflammatory disorders of cervix uteri: Secondary | ICD-10-CM | POA: Diagnosis not present

## 2016-01-29 DIAGNOSIS — Z9851 Tubal ligation status: Secondary | ICD-10-CM | POA: Diagnosis not present

## 2016-01-29 DIAGNOSIS — Z8673 Personal history of transient ischemic attack (TIA), and cerebral infarction without residual deficits: Secondary | ICD-10-CM | POA: Insufficient documentation

## 2016-01-29 DIAGNOSIS — Z90710 Acquired absence of both cervix and uterus: Secondary | ICD-10-CM | POA: Diagnosis not present

## 2016-01-29 DIAGNOSIS — M1990 Unspecified osteoarthritis, unspecified site: Secondary | ICD-10-CM | POA: Diagnosis not present

## 2016-01-29 DIAGNOSIS — N838 Other noninflammatory disorders of ovary, fallopian tube and broad ligament: Secondary | ICD-10-CM | POA: Diagnosis not present

## 2016-01-29 DIAGNOSIS — N8000 Endometriosis of the uterus, unspecified: Secondary | ICD-10-CM | POA: Diagnosis present

## 2016-01-29 DIAGNOSIS — Z87891 Personal history of nicotine dependence: Secondary | ICD-10-CM | POA: Diagnosis not present

## 2016-01-29 DIAGNOSIS — F329 Major depressive disorder, single episode, unspecified: Secondary | ICD-10-CM | POA: Diagnosis not present

## 2016-01-29 DIAGNOSIS — N809 Endometriosis, unspecified: Secondary | ICD-10-CM

## 2016-01-29 DIAGNOSIS — N8003 Adenomyosis of the uterus: Secondary | ICD-10-CM | POA: Diagnosis present

## 2016-01-29 DIAGNOSIS — F419 Anxiety disorder, unspecified: Secondary | ICD-10-CM | POA: Insufficient documentation

## 2016-01-29 DIAGNOSIS — N946 Dysmenorrhea, unspecified: Secondary | ICD-10-CM | POA: Diagnosis present

## 2016-01-29 HISTORY — PX: LAPAROSCOPIC VAGINAL HYSTERECTOMY WITH SALPINGECTOMY: SHX6680

## 2016-01-29 HISTORY — DX: Acquired absence of both cervix and uterus: Z90.710

## 2016-01-29 HISTORY — DX: Endometriosis, unspecified: N80.9

## 2016-01-29 HISTORY — DX: Dysmenorrhea, unspecified: N94.6

## 2016-01-29 LAB — PREGNANCY, URINE: Preg Test, Ur: NEGATIVE

## 2016-01-29 SURGERY — HYSTERECTOMY, VAGINAL, LAPAROSCOPY-ASSISTED, WITH SALPINGECTOMY
Anesthesia: General | Laterality: Bilateral

## 2016-01-29 MED ORDER — MENTHOL 3 MG MT LOZG: 1.0000 | LOZENGE | OROMUCOSAL | Status: DC | PRN

## 2016-01-29 MED ORDER — ROCURONIUM BROMIDE 100 MG/10ML IV SOLN
INTRAVENOUS | Status: DC | PRN
  Administered 2016-01-29: 30 mg via INTRAVENOUS
  Administered 2016-01-29: 10 mg via INTRAVENOUS

## 2016-01-29 MED ORDER — SIMETHICONE 80 MG PO CHEW: 80.0000 mg | CHEWABLE_TABLET | Freq: Four times a day (QID) | ORAL | Status: DC | PRN

## 2016-01-29 MED ORDER — MIDAZOLAM HCL 2 MG/2ML IJ SOLN
INTRAMUSCULAR | Status: AC
  Filled 2016-01-29: qty 2

## 2016-01-29 MED ORDER — LORATADINE 10 MG PO TABS
10.0000 mg | ORAL_TABLET | Freq: Every evening | ORAL | Status: DC
  Filled 2016-01-29: qty 1

## 2016-01-29 MED ORDER — FENTANYL CITRATE (PF) 100 MCG/2ML IJ SOLN: 25.0000 ug | INTRAMUSCULAR | Status: DC | PRN

## 2016-01-29 MED ORDER — ROCURONIUM BROMIDE 100 MG/10ML IV SOLN
INTRAVENOUS | Status: AC
  Filled 2016-01-29: qty 1

## 2016-01-29 MED ORDER — GUAIFENESIN 100 MG/5ML PO SOLN: 15.0000 mL | ORAL | Status: DC | PRN

## 2016-01-29 MED ORDER — SODIUM CHLORIDE 0.9% FLUSH: 9.0000 mL | INTRAVENOUS | Status: DC | PRN

## 2016-01-29 MED ORDER — ONDANSETRON HCL 4 MG/2ML IJ SOLN: 4.0000 mg | Freq: Once | INTRAMUSCULAR | Status: DC

## 2016-01-29 MED ORDER — FLUTICASONE PROPIONATE 50 MCG/ACT NA SUSP: 2.0000 | Freq: Every day | NASAL | Status: DC | PRN

## 2016-01-29 MED ORDER — SCOPOLAMINE 1 MG/3DAYS TD PT72
MEDICATED_PATCH | TRANSDERMAL | Status: AC
  Administered 2016-01-29: 1.5 mg via TRANSDERMAL
  Filled 2016-01-29: qty 1

## 2016-01-29 MED ORDER — LIDOCAINE HCL (CARDIAC) 20 MG/ML IV SOLN
INTRAVENOUS | Status: AC
  Filled 2016-01-29: qty 5

## 2016-01-29 MED ORDER — ONDANSETRON HCL 4 MG PO TABS: 4.0000 mg | ORAL_TABLET | Freq: Four times a day (QID) | ORAL | Status: DC | PRN

## 2016-01-29 MED ORDER — GLYCOPYRROLATE 0.2 MG/ML IJ SOLN
INTRAMUSCULAR | Status: AC
  Filled 2016-01-29: qty 2

## 2016-01-29 MED ORDER — BUPIVACAINE HCL (PF) 0.25 % IJ SOLN
INTRAMUSCULAR | Status: DC | PRN
Start: 2016-01-29 — End: 2016-01-29
  Administered 2016-01-29: 10 mL

## 2016-01-29 MED ORDER — SODIUM CHLORIDE 0.9 % IJ SOLN
INTRAMUSCULAR | Status: AC
  Filled 2016-01-29: qty 10

## 2016-01-29 MED ORDER — LACTATED RINGERS IV SOLN
INTRAVENOUS | Status: DC
  Administered 2016-01-29 (×3): via INTRAVENOUS

## 2016-01-29 MED ORDER — LACTATED RINGERS IR SOLN
Status: DC | PRN
  Administered 2016-01-29: 3000 mL

## 2016-01-29 MED ORDER — MIDAZOLAM HCL 2 MG/2ML IJ SOLN
INTRAMUSCULAR | Status: DC | PRN
  Administered 2016-01-29: 2 mg via INTRAVENOUS

## 2016-01-29 MED ORDER — SODIUM CHLORIDE 0.9 % IJ SOLN
INTRAMUSCULAR | Status: DC | PRN
  Administered 2016-01-29: 10 mL

## 2016-01-29 MED ORDER — VASOPRESSIN 20 UNIT/ML IV SOLN
INTRAVENOUS | Status: DC | PRN
  Administered 2016-01-29: 20 mL via INTRAMUSCULAR

## 2016-01-29 MED ORDER — KETOROLAC TROMETHAMINE 0.5 % OP SOLN
1.0000 [drp] | Freq: Three times a day (TID) | OPHTHALMIC | Status: DC | PRN
  Administered 2016-01-29: 1 [drp] via OPHTHALMIC
  Filled 2016-01-29: qty 3

## 2016-01-29 MED ORDER — SODIUM CHLORIDE 0.9 % IJ SOLN
INTRAMUSCULAR | Status: AC
  Filled 2016-01-29: qty 100

## 2016-01-29 MED ORDER — MEPERIDINE HCL 25 MG/ML IJ SOLN: 6.2500 mg | INTRAMUSCULAR | Status: DC | PRN

## 2016-01-29 MED ORDER — FENTANYL CITRATE (PF) 250 MCG/5ML IJ SOLN
INTRAMUSCULAR | Status: AC
  Filled 2016-01-29: qty 5

## 2016-01-29 MED ORDER — CEFAZOLIN SODIUM-DEXTROSE 2-4 GM/100ML-% IV SOLN
2.0000 g | INTRAVENOUS | Status: AC
  Administered 2016-01-29: 2 g via INTRAVENOUS
  Filled 2016-01-29: qty 100

## 2016-01-29 MED ORDER — SCOPOLAMINE 1 MG/3DAYS TD PT72
1.0000 | MEDICATED_PATCH | Freq: Once | TRANSDERMAL | Status: DC
  Administered 2016-01-29: 1.5 mg via TRANSDERMAL

## 2016-01-29 MED ORDER — OXYCODONE-ACETAMINOPHEN 5-325 MG PO TABS
1.0000 | ORAL_TABLET | ORAL | Status: DC | PRN
  Administered 2016-01-30: 1 via ORAL
  Filled 2016-01-29: qty 1

## 2016-01-29 MED ORDER — DEXAMETHASONE SODIUM PHOSPHATE 4 MG/ML IJ SOLN
INTRAMUSCULAR | Status: AC
  Filled 2016-01-29: qty 1

## 2016-01-29 MED ORDER — NEOSTIGMINE METHYLSULFATE 10 MG/10ML IV SOLN
INTRAVENOUS | Status: AC
  Filled 2016-01-29: qty 1

## 2016-01-29 MED ORDER — LEVOCETIRIZINE DIHYDROCHLORIDE 5 MG PO TABS: 5.0000 mg | ORAL_TABLET | Freq: Every evening | ORAL | Status: DC

## 2016-01-29 MED ORDER — BUPIVACAINE HCL (PF) 0.25 % IJ SOLN
INTRAMUSCULAR | Status: AC
  Filled 2016-01-29: qty 30

## 2016-01-29 MED ORDER — DIPHENHYDRAMINE HCL 12.5 MG/5ML PO ELIX: 12.5000 mg | ORAL_SOLUTION | Freq: Four times a day (QID) | ORAL | Status: DC | PRN

## 2016-01-29 MED ORDER — LACTATED RINGERS IV SOLN
INTRAVENOUS | Status: DC
  Administered 2016-01-29 – 2016-01-30 (×3): via INTRAVENOUS

## 2016-01-29 MED ORDER — HYDROMORPHONE 1 MG/ML IV SOLN
INTRAVENOUS | Status: DC
  Administered 2016-01-29: 15:00:00 via INTRAVENOUS
  Filled 2016-01-29: qty 25

## 2016-01-29 MED ORDER — LIDOCAINE HCL (CARDIAC) 20 MG/ML IV SOLN
INTRAVENOUS | Status: DC | PRN
  Administered 2016-01-29: 80 mg via INTRAVENOUS

## 2016-01-29 MED ORDER — LACTATED RINGERS IV SOLN: INTRAVENOUS | Status: DC

## 2016-01-29 MED ORDER — EPHEDRINE SULFATE 50 MG/ML IJ SOLN
INTRAMUSCULAR | Status: DC | PRN
  Administered 2016-01-29: 10 mg via INTRAVENOUS
  Administered 2016-01-29 (×2): 5 mg via INTRAVENOUS
  Administered 2016-01-29: 15 mg via INTRAVENOUS
  Administered 2016-01-29: 10 mg via INTRAVENOUS

## 2016-01-29 MED ORDER — ALUM & MAG HYDROXIDE-SIMETH 200-200-20 MG/5ML PO SUSP: 30.0000 mL | ORAL | Status: DC | PRN

## 2016-01-29 MED ORDER — LORAZEPAM 1 MG PO TABS
1.0000 mg | ORAL_TABLET | Freq: Every day | ORAL | Status: DC
  Administered 2016-01-30: 1 mg via ORAL
  Filled 2016-01-29: qty 1

## 2016-01-29 MED ORDER — ONDANSETRON HCL 4 MG/2ML IJ SOLN: 4.0000 mg | Freq: Four times a day (QID) | INTRAMUSCULAR | Status: DC | PRN

## 2016-01-29 MED ORDER — FENTANYL CITRATE (PF) 100 MCG/2ML IJ SOLN
INTRAMUSCULAR | Status: DC | PRN
Start: 2016-01-29 — End: 2016-01-29
  Administered 2016-01-29: 50 ug via INTRAVENOUS
  Administered 2016-01-29: 100 ug via INTRAVENOUS

## 2016-01-29 MED ORDER — PHENYLEPHRINE 40 MCG/ML (10ML) SYRINGE FOR IV PUSH (FOR BLOOD PRESSURE SUPPORT)
PREFILLED_SYRINGE | INTRAVENOUS | Status: AC
  Filled 2016-01-29: qty 10

## 2016-01-29 MED ORDER — NALOXONE HCL 0.4 MG/ML IJ SOLN
0.4000 mg | INTRAMUSCULAR | Status: DC | PRN
Start: 2016-01-29 — End: 2016-01-29

## 2016-01-29 MED ORDER — IBUPROFEN 800 MG PO TABS
800.0000 mg | ORAL_TABLET | Freq: Three times a day (TID) | ORAL | Status: DC | PRN
  Administered 2016-01-30: 800 mg via ORAL
  Filled 2016-01-29: qty 1

## 2016-01-29 MED ORDER — PROPOFOL 10 MG/ML IV BOLUS
INTRAVENOUS | Status: DC | PRN
  Administered 2016-01-29: 160 mg via INTRAVENOUS

## 2016-01-29 MED ORDER — CEFAZOLIN SODIUM-DEXTROSE 2-3 GM-% IV SOLR
INTRAVENOUS | Status: AC
Start: 2016-01-29 — End: 2016-01-29
  Filled 2016-01-29: qty 50

## 2016-01-29 MED ORDER — PHENYLEPHRINE HCL 10 MG/ML IJ SOLN
INTRAMUSCULAR | Status: DC | PRN
  Administered 2016-01-29: 80 ug via INTRAVENOUS

## 2016-01-29 MED ORDER — NEOSTIGMINE METHYLSULFATE 10 MG/10ML IV SOLN
INTRAVENOUS | Status: DC | PRN
  Administered 2016-01-29: 3 mg via INTRAVENOUS

## 2016-01-29 MED ORDER — DEXAMETHASONE SODIUM PHOSPHATE 10 MG/ML IJ SOLN
INTRAMUSCULAR | Status: DC | PRN
  Administered 2016-01-29: 4 mg via INTRAVENOUS

## 2016-01-29 MED ORDER — BSS IO SOLN
15.0000 mL | Freq: Once | INTRAOCULAR | Status: AC
  Administered 2016-01-29: 15 mL via INTRAOCULAR
  Filled 2016-01-29: qty 15

## 2016-01-29 MED ORDER — LUBIPROSTONE 24 MCG PO CAPS
24.0000 ug | ORAL_CAPSULE | Freq: Two times a day (BID) | ORAL | Status: DC
  Filled 2016-01-29 (×2): qty 1

## 2016-01-29 MED ORDER — ONDANSETRON HCL 4 MG/2ML IJ SOLN
INTRAMUSCULAR | Status: AC
  Filled 2016-01-29: qty 2

## 2016-01-29 MED ORDER — GLYCOPYRROLATE 0.2 MG/ML IJ SOLN
INTRAMUSCULAR | Status: DC | PRN
  Administered 2016-01-29: 0.4 mg via INTRAVENOUS

## 2016-01-29 MED ORDER — PROPOFOL 10 MG/ML IV BOLUS
INTRAVENOUS | Status: AC
  Filled 2016-01-29: qty 20

## 2016-01-29 MED ORDER — DIPHENHYDRAMINE HCL 50 MG/ML IJ SOLN: 12.5000 mg | Freq: Four times a day (QID) | INTRAMUSCULAR | Status: DC | PRN

## 2016-01-29 MED ORDER — KETOROLAC TROMETHAMINE 30 MG/ML IJ SOLN
INTRAMUSCULAR | Status: DC | PRN
  Administered 2016-01-29: 30 mg via INTRAVENOUS

## 2016-01-29 MED ORDER — ONDANSETRON HCL 4 MG/2ML IJ SOLN
INTRAMUSCULAR | Status: DC | PRN
  Administered 2016-01-29: 4 mg via INTRAVENOUS

## 2016-01-29 MED ORDER — EPHEDRINE 5 MG/ML INJ
INTRAVENOUS | Status: AC
  Filled 2016-01-29: qty 10

## 2016-01-29 MED ORDER — VASOPRESSIN 20 UNIT/ML IV SOLN
INTRAVENOUS | Status: AC
  Filled 2016-01-29: qty 1

## 2016-01-29 SURGICAL SUPPLY — 38 items
CABLE HIGH FREQUENCY MONO STRZ (ELECTRODE) IMPLANT
CLOSURE WOUND 1/4 X3 (GAUZE/BANDAGES/DRESSINGS)
CLOTH BEACON ORANGE TIMEOUT ST (SAFETY) ×3 IMPLANT
CONT PATH 16OZ SNAP LID 3702 (MISCELLANEOUS) ×3 IMPLANT
COVER BACK TABLE 60X90IN (DRAPES) ×3 IMPLANT
DECANTER SPIKE VIAL GLASS SM (MISCELLANEOUS) ×2 IMPLANT
DRSG COVADERM PLUS 2X2 (GAUZE/BANDAGES/DRESSINGS) ×6 IMPLANT
DRSG OPSITE POSTOP 3X4 (GAUZE/BANDAGES/DRESSINGS) IMPLANT
DURAPREP 26ML APPLICATOR (WOUND CARE) ×3 IMPLANT
ELECT REM PT RETURN 9FT ADLT (ELECTROSURGICAL) ×3
ELECTRODE REM PT RTRN 9FT ADLT (ELECTROSURGICAL) IMPLANT
FILTER SMOKE EVAC LAPAROSHD (FILTER) ×3 IMPLANT
GLOVE BIO SURGEON STRL SZ 6.5 (GLOVE) ×6 IMPLANT
GLOVE BIO SURGEONS STRL SZ 6.5 (GLOVE) ×3
GLOVE BIOGEL PI IND STRL 7.0 (GLOVE) ×3 IMPLANT
GLOVE BIOGEL PI INDICATOR 7.0 (GLOVE) ×6
LEGGING LITHOTOMY PAIR STRL (DRAPES) ×3 IMPLANT
NEEDLE INSUFFLATION 120MM (ENDOMECHANICALS) ×3 IMPLANT
NS IRRIG 1000ML POUR BTL (IV SOLUTION) ×3 IMPLANT
PACK LAVH (CUSTOM PROCEDURE TRAY) ×3 IMPLANT
PACK ROBOTIC GOWN (GOWN DISPOSABLE) ×3 IMPLANT
PAD TRENDELENBURG POSITION (MISCELLANEOUS) ×3 IMPLANT
SET CYSTO W/LG BORE CLAMP LF (SET/KITS/TRAYS/PACK) IMPLANT
SET IRRIG TUBING LAPAROSCOPIC (IRRIGATION / IRRIGATOR) ×2 IMPLANT
SHEARS HARMONIC ACE PLUS 36CM (ENDOMECHANICALS) ×3 IMPLANT
SLEEVE XCEL OPT CAN 5 100 (ENDOMECHANICALS) ×6 IMPLANT
STRIP CLOSURE SKIN 1/4X3 (GAUZE/BANDAGES/DRESSINGS) IMPLANT
SUT VIC AB 1 CT1 18XBRD ANBCTR (SUTURE) ×2 IMPLANT
SUT VIC AB 1 CT1 8-18 (SUTURE) ×6
SUT VIC AB 2-0 CT1 (SUTURE) ×3 IMPLANT
SUT VIC AB 4-0 PS2 27 (SUTURE) ×3 IMPLANT
SUT VICRYL 0 TIES 12 18 (SUTURE) ×3 IMPLANT
SUT VICRYL 0 UR6 27IN ABS (SUTURE) IMPLANT
TOWEL OR 17X24 6PK STRL BLUE (TOWEL DISPOSABLE) ×6 IMPLANT
TRAY FOLEY CATH SILVER 14FR (SET/KITS/TRAYS/PACK) ×3 IMPLANT
TROCAR XCEL NON-BLD 5MMX100MML (ENDOMECHANICALS) ×3 IMPLANT
WARMER LAPAROSCOPE (MISCELLANEOUS) ×3 IMPLANT
WATER STERILE IRR 1000ML POUR (IV SOLUTION) ×3 IMPLANT

## 2016-01-29 NOTE — Progress Notes (Signed)
Day of Surgery Procedure(s) (LRB): LAPAROSCOPIC ASSISTED VAGINAL HYSTERECTOMY WITH SALPINGECTOMY (Bilateral)  Subjective: Patient reports tolerating PO clears.  Pain well-controlled.  C/o right eye feeling scratchy and irritated, looks a bit red.    Objective: I have reviewed patient's vital signs, intake and output and medications.  Good UOP.  General: alert and cooperative GI: soft NT, incisions clear  Assessment: s/p Procedure(s): LAPAROSCOPIC ASSISTED VAGINAL HYSTERECTOMY WITH SALPINGECTOMY (Bilateral): stable  Plan: Doing well.  Possible small right eye abrasion, drops ordered by anesthesia.  Continue care.     Logan Bores 01/29/2016, 5:32 PM

## 2016-01-29 NOTE — Anesthesia Postprocedure Evaluation (Signed)
Anesthesia Post Note  Patient: Christine Cunningham  Procedure(s) Performed: Procedure(s) (LRB): LAPAROSCOPIC ASSISTED VAGINAL HYSTERECTOMY WITH SALPINGECTOMY (Bilateral)  Patient location during evaluation: [facility] Anesthesia Type: General Level of consciousness: awake and alert Pain management: satisfactory to patient Vital Signs Assessment: post-procedure vital signs reviewed and stable Respiratory status: spontaneous breathing, respiratory function stable and patient connected to nasal cannula oxygen Cardiovascular status: stable Postop Assessment: adequate PO intake Anesthetic complications: no (THe patient reports that her right eye is sore. She states that she has a habit of rubbing her eyes and it wasn't sore when she first entered PACU.  She thinks she accidently scrached it trying to become more awake in PACU. Dr. Jillyn Hidden notified .   )     Last Vitals:  Filed Vitals:   01/29/16 1558 01/29/16 1650  BP: 92/45 94/49  Pulse: 64 64  Temp: 36.5 C 36.5 C  Resp: 18 16    Last Pain:  Filed Vitals:   01/29/16 1710  PainSc: 3    Pain Goal: Patients Stated Pain Goal: 4 (01/29/16 1420)               Elam Ellis

## 2016-01-29 NOTE — Anesthesia Postprocedure Evaluation (Signed)
Anesthesia Post Note  Patient: Christine Cunningham  Procedure(s) Performed: Procedure(s) (LRB): LAPAROSCOPIC ASSISTED VAGINAL HYSTERECTOMY WITH SALPINGECTOMY (Bilateral)  Patient location during evaluation: PACU Anesthesia Type: General Level of consciousness: awake and alert Pain management: pain level controlled Vital Signs Assessment: post-procedure vital signs reviewed and stable Respiratory status: spontaneous breathing, nonlabored ventilation, respiratory function stable and patient connected to nasal cannula oxygen Cardiovascular status: blood pressure returned to baseline and stable Postop Assessment: no signs of nausea or vomiting Anesthetic complications: no     Last Vitals:  Filed Vitals:   01/29/16 1245 01/29/16 1300  BP: 101/47 100/46  Pulse: 80 78  Temp:    Resp: 12 14    Last Pain: There were no vitals filed for this visit. Pain Goal: Patients Stated Pain Goal: 4 (01/29/16 0847)               Alexis Frock

## 2016-01-29 NOTE — Addendum Note (Signed)
Addendum  created 01/29/16 1730 by Flossie Dibble, CRNA   Modules edited: Notes Section   Notes Section:  File: [id number]

## 2016-01-29 NOTE — Addendum Note (Signed)
Addendum  created 01/29/16 1723 by Lyn Hollingshead, MD   Modules edited: Orders, PRL Based Order Sets

## 2016-01-29 NOTE — Interval H&P Note (Signed)
History and Physical Interval Note:  01/29/2016 9:00 AM  Christine Cunningham  has presented today for surgery, with the diagnosis of Endometriosis of Uterus  The various methods of treatment have been discussed with the patient and family. After consideration of risks, benefits and other options for treatment, the patient has consented to  Procedure(s): LAPAROSCOPIC ASSISTED VAGINAL HYSTERECTOMY WITH SALPINGECTOMY (Bilateral) as a surgical intervention .  The patient's history has been reviewed, patient examined, no change in status, stable for surgery.  I have reviewed the patient's chart and labs.  Questions were answered to the patient's satisfaction.     [name], Freddie Nghiem

## 2016-01-29 NOTE — Anesthesia Procedure Notes (Signed)
Procedure Name: Intubation Date/Time: 01/29/2016 9:41 AM Performed by: Casimer Lanius A Pre-anesthesia Checklist: Patient identified, Emergency Drugs available, Suction available and Patient being monitored Patient Re-evaluated:Patient Re-evaluated prior to inductionOxygen Delivery Method: Circle system utilized and Simple face mask Preoxygenation: Pre-oxygenation with 100% oxygen Intubation Type: IV induction Ventilation: Mask ventilation without difficulty Laryngoscope Size: Mac and 3 Grade View: Grade II Tube type: Oral Tube size: 7.0 mm Number of attempts: 1 Airway Equipment and Method: Stylet Placement Confirmation: ETT inserted through vocal cords under direct vision,  positive ETCO2 and breath sounds checked- equal and bilateral Secured at: 20 (right lip) cm Tube secured with: Tape Dental Injury: Teeth and Oropharynx as per pre-operative assessment

## 2016-01-29 NOTE — Transfer of Care (Signed)
Immediate Anesthesia Transfer of Care Note  Patient: Christine Cunningham  Procedure(s) Performed: Procedure(s): LAPAROSCOPIC ASSISTED VAGINAL HYSTERECTOMY WITH SALPINGECTOMY (Bilateral)  Patient Location: PACU  Anesthesia Type:General  Level of Consciousness: awake  Airway & Oxygen Therapy: Patient Spontanous Breathing  Post-op Assessment: Report given to PACU RN  Post vital signs: stable  Filed Vitals:   01/29/16 0847  BP: 108/69  Pulse: 79  Temp: 36.9 C  Resp: 20    Complications: No apparent anesthesia complications

## 2016-01-29 NOTE — Brief Op Note (Signed)
01/29/2016  12:34 PM  PATIENT:  Christine Cunningham  45 y.o. female  PRE-OPERATIVE DIAGNOSIS:  Endometriosis of Uterus  POST-OPERATIVE DIAGNOSIS:  Endometriosis of Uterus  PROCEDURE:  Procedure(s): LAPAROSCOPIC ASSISTED VAGINAL HYSTERECTOMY WITH SALPINGECTOMY (Bilateral)  SURGEON:  Surgeon(s) and Role:    * Janyth Contes, MD - Primary    * Cheri Fowler, MD - Assisting  ANESTHESIA:   local and general by LMA  EBL:  Total I/O In: 2600 [I.V.:2600] Out: 250 [Urine:200; Blood:50]  BLOOD ADMINISTERED:none  DRAINS: Urinary Catheter (Foley)   LOCAL MEDICATIONS USED:  MARCAINE/Pitressin      SPECIMEN:  Source of Specimen:  uterus and B tubes  DISPOSITION OF SPECIMEN:  PATHOLOGY  COUNTS:  YES  TOURNIQUET:  * No tourniquets in log *  DICTATION: .Other Dictation: Dictation Number [id number]  PLAN OF CARE: Admit for overnight observation  PATIENT DISPOSITION:  PACU - hemodynamically stable.   Delay start of Pharmacological VTE agent (>24hrs) due to surgical blood loss or risk of bleeding: no

## 2016-01-30 ENCOUNTER — Encounter (HOSPITAL_COMMUNITY): Payer: Self-pay | Admitting: Obstetrics and Gynecology

## 2016-01-30 DIAGNOSIS — N888 Other specified noninflammatory disorders of cervix uteri: Secondary | ICD-10-CM | POA: Diagnosis not present

## 2016-01-30 LAB — CBC
HEMATOCRIT: 29.9 % — AB (ref 36.0–46.0)
HEMOGLOBIN: 9.8 g/dL — AB (ref 12.0–15.0)
MCH: 27.5 pg (ref 26.0–34.0)
MCHC: 32.8 g/dL (ref 30.0–36.0)
MCV: 83.8 fL (ref 78.0–100.0)
Platelets: 224 10*3/uL (ref 150–400)
RBC: 3.57 MIL/uL — ABNORMAL LOW (ref 3.87–5.11)
RDW: 16 % — ABNORMAL HIGH (ref 11.5–15.5)
WBC: 10.9 10*3/uL — AB (ref 4.0–10.5)

## 2016-01-30 LAB — BASIC METABOLIC PANEL
ANION GAP: 8 (ref 5–15)
BUN: 12 mg/dL (ref 6–20)
CO2: 24 mmol/L (ref 22–32)
Calcium: 8.6 mg/dL — ABNORMAL LOW (ref 8.9–10.3)
Chloride: 106 mmol/L (ref 101–111)
Creatinine, Ser: 0.78 mg/dL (ref 0.44–1.00)
GFR calc Af Amer: >60 mL/min (ref >60)
GLUCOSE: 105 mg/dL — AB (ref 65–99)
POTASSIUM: 4.2 mmol/L (ref 3.5–5.1)
Sodium: 138 mmol/L (ref 135–145)

## 2016-01-30 MED ORDER — OXYCODONE-ACETAMINOPHEN 5-325 MG PO TABS: 1.0000 | ORAL_TABLET | Freq: Four times a day (QID) | ORAL | Status: DC | PRN

## 2016-01-30 MED ORDER — IBUPROFEN 800 MG PO TABS: 800.0000 mg | ORAL_TABLET | Freq: Three times a day (TID) | ORAL | Status: DC | PRN

## 2016-01-30 NOTE — Discharge Summary (Signed)
Physician Discharge Summary  Patient ID: Christine Cunningham MRN: [id number] DOB/AGE: 1970-11-19 45 y.o.  Admit date: 01/29/2016 Discharge date: 01/30/2016  Admission Diagnoses:  Discharge Diagnoses:  Principal Problem:   S/P laparoscopic assisted vaginal hysterectomy (LAVH) Active Problems:   Adenomyosis   Dysmenorrhea   Discharged Condition: good  Hospital Course: Had LAVH/BS 4/26, stayed overnight.  D/c to home on POD#1, ambulating, voiding and pain controlled.  Appropriate drop in Hgb, pt asymptomatic.  Cr stable.  D/c to home with appropriate d/c instructions  Consults: None  Significant Diagnostic Studies: labs: CBC, BMP  Treatments: IV hydration, analgesia: Dilaudid pca and surgery: LAVH/BS  Discharge Exam: Blood pressure 112/83, pulse 62, temperature 98.4 F (36.9 C), temperature source Oral, resp. rate 18, height 5\' 2"  (1.575 m), weight 67.132 kg (148 lb), SpO2 100 %. General appearance: alert, cooperative and no distress Resp: clear to auscultation bilaterally Cardio: regular rate and rhythm GI: soft, non-tender; bowel sounds normal; no masses,  no organomegaly and Inc C/D/I Extremities: extremities normal, atraumatic, no cyanosis or edema  Disposition: 01-Home or Self Care  Discharge Instructions    Call MD for:  persistant nausea and vomiting    Complete by:  As directed      Call MD for:  redness, tenderness, or signs of infection (pain, swelling, redness, odor or green/yellow discharge around incision site)    Complete by:  As directed      Call MD for:  severe uncontrolled pain    Complete by:  As directed      Diet - low sodium heart healthy    Complete by:  As directed      Discharge instructions    Complete by:  As directed   Call [phone] with questions or problems     Driving Restrictions    Complete by:  As directed   While taking strong pain medicine     Increase activity slowly    Complete by:  As directed      Lifting restrictions    Complete  by:  As directed   No greater than 10-15lbs for 6 weeks     May shower / Bathe    Complete by:  As directed      May walk up steps    Complete by:  As directed      Sexual Activity Restrictions    Complete by:  As directed   Pelvic rest - no douching, tampons or sex for 6 weeks            Medication List    STOP taking these medications        amoxicillin-clavulanate 875-125 MG tablet  Commonly known as:  AUGMENTIN     naproxen 500 MG tablet  Commonly known as:  NAPROSYN      TAKE these medications        fluticasone 50 MCG/ACT nasal spray  Commonly known as:  FLONASE  2 sprays each nostril once daily at bedtime     ibuprofen 800 MG tablet  Commonly known as:  ADVIL,MOTRIN  Take 1 tablet (800 mg total) by mouth every 8 (eight) hours as needed.     LORazepam 1 MG tablet  Commonly known as:  ATIVAN  Take 1 tablet (1 mg total) by mouth daily.     lubiprostone 24 MCG capsule  Commonly known as:  AMITIZA  Take 24 mcg by mouth 2 (two) times daily with a meal.     oxyCODONE-acetaminophen 5-325 MG tablet  Commonly known as:  PERCOCET/ROXICET  Take 1-2 tablets by mouth every 6 (six) hours as needed for severe pain.     phentermine 37.5 MG capsule  Take 37.5 mg by mouth every morning.      ASK your doctor about these medications        levocetirizine 5 MG tablet  Commonly known as:  XYZAL  TAKE 1 TABLET (5 MG TOTAL) BY MOUTH EVERY EVENING.           Follow-up Information    Follow up with [name], Jung Ingerson, MD. Schedule an appointment as soon as possible for a visit in 2 weeks.   Specialty:  Obstetrics and Gynecology   Why:  for incision check, 6 weeks for postoperative exam   Contact information:   [address] [place] [address] [phone]       Signed: Janyth Contes 01/30/2016, 7:41 AM

## 2016-01-30 NOTE — Progress Notes (Signed)
Patient discharged home with daughter... Discharge instructions reviewed with patient and she verbalized understanding... Condition stable... No equipment... Ambulated to car with J. Bass, NT.  
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
## 2016-01-30 NOTE — Op Note (Signed)
Christine Cunningham, Christine Cunningham                ACCOUNT NO.:  [id number]  MEDICAL RECORD NO.:  [id number]  LOCATION:  9306                          FACILITY:  [facility]  PHYSICIAN:  Thornell Sartorius, MD        DATE OF BIRTH:  21-May-1971  DATE OF PROCEDURE:  01/29/2016 DATE OF DISCHARGE:                              OPERATIVE REPORT   PREOPERATIVE DIAGNOSIS:  Adenomyosis, enlarged uterus.  POSTOPERATIVE DIAGNOSIS:  Adenomyosis, enlarged uterus.  PROCEDURE:  LAVH, B salpingectomy  SURGEON:  Thornell Sartorius, MD  Assistant: Cheri Fowler MD  EBL: 50cc  IV FLUIDS:  100 mL.  URINE OUTPUT:  200 mL of clear urine at the end of procedure.  DESCRIPTION OF PROCEDURE:  After informed consent was reviewed with the patient and her family, she was transported to the operating room, placed on the table in supine position.  A Hulka manipulator was placed. Foley catheter was also sterilely placed.  Gloves and gowns were changed.  Attention was turned to the abdominal portion of the case. Using a Veress needle, pneumoperitoneum was obtained. After placement confirmed by the hanging drop test the trocar was placed.  The scope was placed under direct visualization.  Accessory ports were placed on the left and right.  The distal end of the tube was grasped with a million dollar grasper.  The portion of the tube was placed in the cul-de-sac/  The cornu was grasped and the round ligament were incised using Harmonic scalpel to the level of the uterine artery.  Bladder flap was created.  Same procedure was repeated bilaterally and attention was turned to the single peritoneal adhesion and instruments were removed.  The cervix was insufflated with 20 mL of Pitressin solution, 1 and 100.  The cervix was circumscribed with Bovie cautery.  There was an attempt to made to enter anteriorly, this was unsuccessful and posteriorly, the peritoneum was entered.  The banano speculum was placed.  The uterosacral ligaments were ligated  and held in a stepwise fashion. Several bites were made on the other side.  An anterior cul-de-sac was entered and the uterus was delivered and sent to Pathology.  Bleeders were stopped with a suture of 3-0 Vicryl.  The pedicles were noted to be hemostatic.  The uterosacral ligaments were plicated and held sutures were ligated together as well.  The cuff was closed with 2-0 Vicryl in a running locked fashion.  The gloves and gowns were changed. Attention was turned back to the abdominal portion of the case.  The anterior part was irrigated and noted to be hemostatic.  The pneumoperitoneum was evacuated.  The incisions were closed with 4-0 Vicryl and Dermabond.  The patient tolerated the procedure well. Sponge, lap, and needle counts were correct x2 per the operating staff.     Thornell Sartorius, MD     JB/MEDQ  D:  01/29/2016  T:  01/30/2016  Job:  [id number]

## 2016-01-30 NOTE — Progress Notes (Signed)
1 Day Post-Op Procedure(s) (LRB): LAPAROSCOPIC ASSISTED VAGINAL HYSTERECTOMY WITH SALPINGECTOMY (Bilateral)  Subjective: Patient reports incisional pain and tolerating PO.  Pain controlled.  ambulating    Objective: I have reviewed patient's vital signs, intake and output, medications and labs.  General: alert and no distress Resp: clear to auscultation bilaterally Cardio: regular rate and rhythm GI: soft, non-tender; bowel sounds normal; no masses,  no organomegaly and incision: clean, dry and intact Extremities: extremities normal, atraumatic, no cyanosis or edema  Assessment: s/p Procedure(s): LAPAROSCOPIC ASSISTED VAGINAL HYSTERECTOMY WITH SALPINGECTOMY (Bilateral): stable and progressing well  Plan: Encourage ambulation Advance to PO medication Discontinue IV fluids Discharge home when ambulating, voiding and pain controlled with po meds' f/u 2 weeks.       [name], Christine Cunningham 01/30/2016, 6:11 AM

## 2016-02-13 ENCOUNTER — Ambulatory Visit: Payer: Medicaid Other

## 2016-02-21 ENCOUNTER — Ambulatory Visit: Payer: Medicaid Other

## 2016-03-10 ENCOUNTER — Ambulatory Visit
Admission: RE | Admit: 2016-03-10 | Discharge: 2016-03-10 | Disposition: A | Discharge status: Home / Self Care | Payer: Medicaid Other | Source: Ambulatory Visit

## 2016-03-10 DIAGNOSIS — Z1231 Encounter for screening mammogram for malignant neoplasm of breast: Secondary | ICD-10-CM

## 2016-04-09 ENCOUNTER — Telehealth: Payer: Self-pay | Admitting: Internal Medicine

## 2016-04-09 MED ORDER — PREDNISONE 10 MG PO TABS: 10.0000 mg | ORAL_TABLET | Freq: Every day | ORAL | Status: DC

## 2016-04-09 NOTE — Telephone Encounter (Signed)
Spoke with pt. States that her allergies are flaring up. Reports nasal drainage, sinus pressure, cough and sinus headache. Cough is producing clear mucus. Denies chest tightness, wheezing, SOB or fever. Symptoms started last week. Has not tried any OTC medications. She is using Xyzal and Flonase as prescribed. Would like something called in.  No Known Allergies Current Outpatient Prescriptions on File Prior to Visit  Medication Sig Dispense Refill  . fluticasone (FLONASE) 50 MCG/ACT nasal spray 2 sprays each nostril once daily at bedtime 16 g prn  . ibuprofen (ADVIL,MOTRIN) 800 MG tablet Take 1 tablet (800 mg total) by mouth every 8 (eight) hours as needed. 45 tablet 1  . levocetirizine (XYZAL) 5 MG tablet TAKE 1 TABLET (5 MG TOTAL) BY MOUTH EVERY EVENING. 90 tablet 0  . LORazepam (ATIVAN) 1 MG tablet Take 1 tablet (1 mg total) by mouth daily. 4 tablet 0  . lubiprostone (AMITIZA) 24 MCG capsule Take 24 mcg by mouth 2 (two) times daily with a meal.    . oxyCODONE-acetaminophen (PERCOCET/ROXICET) 5-325 MG tablet Take 1-2 tablets by mouth every 6 (six) hours as needed for severe pain. 40 tablet 0  . phentermine 37.5 MG capsule Take 37.5 mg by mouth every morning.     No current facility-administered medications on file prior to visit.    CY - please advise. Thanks.

## 2016-04-09 NOTE — Telephone Encounter (Signed)
Offer prednisone 10 mg, # 7, 1 daily

## 2016-04-09 NOTE — Telephone Encounter (Signed)
Pt aware of rec's per CY Prednisone sent to pharmacy.  Nothing further needed.

## 2016-05-28 ENCOUNTER — Telehealth: Payer: Self-pay | Admitting: Internal Medicine

## 2016-05-28 MED ORDER — AZITHROMYCIN 250 MG PO TABS: ORAL_TABLET | ORAL | 0 refills | Status: DC

## 2016-05-28 NOTE — Telephone Encounter (Signed)
I don't know if this is allergy or a cold. Suggest an otc product like Advil Cold and Sinus, or an antihistamine like Zyrtec

## 2016-05-28 NOTE — Telephone Encounter (Signed)
Suggest Z pak 

## 2016-05-28 NOTE — Telephone Encounter (Signed)
Called and spoke with pt and she stated that she needs something called to the pharmacy where she is at in [place]. Pt c/o runny nose with clear drainage and a sore throat.  She stated that this just started and she is not able to cough anything up out of her chest.  This just started today.    [facility] to [facility]  [address] [place]  [phone]  CY please advise. Thanks Last ov--12/05/15 Next ov--no pending appts

## 2016-05-28 NOTE — Telephone Encounter (Signed)
Called and spoke with pt and she is aware of CY recs and stated that she has already tried the otc meds---but this did just start this morning.  CY please advise, again.  thanks

## 2016-05-28 NOTE — Telephone Encounter (Signed)
Spoke with pt and gave recommendation. She would like rx sent to local [facility] and she will pick up when she returns to town. Rx sent. Nothing further needed.

## 2016-09-22 ENCOUNTER — Ambulatory Visit: Payer: Medicaid Other | Attending: Internal Medicine | Admitting: Physical Therapy

## 2016-09-22 DIAGNOSIS — M545 Low back pain, unspecified: Secondary | ICD-10-CM

## 2016-09-22 DIAGNOSIS — R293 Abnormal posture: Secondary | ICD-10-CM | POA: Insufficient documentation
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
## 2016-09-22 NOTE — Patient Instructions (Addendum)
Sleeping on Back  Place pillow under knees. A pillow with cervical support and a roll around waist are also helpful. Copyright  VHI. All rights reserved.  Sleeping on Side Place pillow between knees. Use cervical support under neck and a roll around waist as needed. Copyright  VHI. All rights reserved.   Sleeping on Stomach   If this is the only desirable sleeping position, place pillow under lower legs, and under stomach or chest as needed.  Posture - Sitting   Sit upright, head facing forward. Try using a roll to support lower back. Keep shoulders relaxed, and avoid rounded back. Keep hips level with knees. Avoid crossing legs for long periods. Stand to Sit / Sit to Stand   To sit: Bend knees to lower self onto front edge of chair, then scoot back on seat. To stand: Reverse sequence by placing one foot forward, and scoot to front of seat. Use rocking motion to stand up.   Work Height and Reach  Ideal work height is no more than 2 to 4 inches below elbow level when standing, and at elbow level when sitting. Reaching should be limited to arm's length, with elbows slightly bent.  Bending  Bend at hips and knees, not back. Keep feet shoulder-width apart.    Posture - Standing   Good posture is important. Avoid slouching and forward head thrust. Maintain curve in low back and align ears over shoul- ders, hips over ankles.  Alternating Positions   Alternate tasks and change positions frequently to reduce fatigue and muscle tension. Take rest breaks. Computer Work   Position work to face forward. Use proper work and seat height. Keep shoulders back and down, wrists straight, and elbows at right angles. Use chair that provides full back support. Add footrest and lumbar roll as needed.  Getting Into / Out of Car  Lower self onto seat, scoot back, then bring in one leg at a time. Reverse sequence to get out.  Dressing  Lie on back to pull socks or slacks over feet, or sit  and bend leg while keeping back straight.    Housework - Sink  Place one foot on ledge of cabinet under sink when standing at sink for prolonged periods.   Pushing / Pulling  Pushing is preferable to pulling. Keep back in proper alignment, and use leg muscles to do the work.  Deep Squat   Squat and lift with both arms held against upper trunk. Tighten stomach muscles without holding breath. Use smooth movements to avoid jerking.  Avoid Twisting   Avoid twisting or bending back. Pivot around using foot movements, and bend at knees if needed when reaching for articles.  Carrying Luggage   Distribute weight evenly on both sides. Use a cart whenever possible. Do not twist trunk. Move body as a unit.   Lifting Principles .Maintain proper posture and head alignment. .Slide object as close as possible before lifting. .Move obstacles out of the way. .Test before lifting; ask for help if too heavy. .Tighten stomach muscles without holding breath. .Use smooth movements; do not jerk. .Use legs to do the work, and pivot with feet. .Distribute the work load symmetrically and close to the center of trunk. .Push instead of pull whenever possible.   Ask For Help   Ask for help and delegate to others when possible. Coordinate your movements when lifting together, and maintain the low back curve.  Log Roll   Lying on back, bend left knee and place left   arm across chest. Roll all in one movement to the right. Reverse to roll to the left. Always move as one unit. Housework - Sweeping  Use long-handled equipment to avoid stooping.   Housework - Wiping  Position yourself as close as possible to reach work surface. Avoid straining your back.  Laundry - Unloading Wash   To unload small items at bottom of washer, lift leg opposite to arm being used to reach.  [place] close to area to be raked. Use arm movements to do the work. Keep back straight and avoid  twisting.     Cart  When reaching into cart with one arm, lift opposite leg to keep back straight.   Getting Into / Out of Bed  Lower self to lie down on one side by raising legs and lowering head at the same time. Use arms to assist moving without twisting. Bend both knees to roll onto back if desired. To sit up, start from lying on side, and use same move-ments in reverse. Housework - Vacuuming  Hold the vacuum with arm held at side. Step back and forth to move it, keeping head up. Avoid twisting.   Laundry - IT consultant so that bending and twisting can be avoided.   Laundry - Unloading Dryer  Squat down to reach into clothes dryer or use a reacher.  Gardening - Weeding / Probation officer or Kneel. Knee pads may be helpful.                     Angry Cat, All Fours   Kneel on hands and knees. Tuck chin and tighten stomach. Exhale and round back upward. Inhale and arch back downward. Hold each position _10__ seconds. Repeat __10_ times per session. Do _2 sessions per day.  Copyright  VHI. All rights reserved.    Lumbar Side-Bend (All-Fours)   Tilt head and shoulder to right side, rotate same side hip toward head. Repeat __10__ times per set. Do _2___ sets per session. Do _2___ sessions per day.  http://orth.exer.us/244   Copyright  VHI. All rights reserved.       Flexion   Sitting on knees, fold body over legs and relax head and arms on floor. Extend arms as far forward as possible. Hold __30_ seconds. Repeat __5__ times. Do ___2_ sessions per day.  Copyright  VHI. All rights reserved.    Reducing Load   Copyright  VHI. All rights reserved.  BODY MECHANICS Tips Good body mechanics are important during activities of daily living. The practice of good body mechanics will: -help distribute weight throughout the skeleton in a more anatomically correct manner thus stimulating more normal forces on the bones, and  encouraging stronger, healthier, denser bones. -reduce unnatural forces on bones, ligaments, joints and muscles and reduce risk of fracture, other injury or back pain. A WORD ON BODY POSITIONING: Sitting is the hardest position for the back. Lying on the back is the easiest. Standing, in good body alignment, is somewhere between. A good motto is: Sit less, stand more, and, when you can't do that, lie down on your back and exercise to strengthen it.  Copyright  VHI. All rights reserved.       Supine to Sit (Active)   Lie on back, left leg bent. Roll to other side. From side-lying, sit up on side of bed. Complete ___ sets of ___ repetitions. Perform ___ sessions per day.  Copyright  VHI.  All rights reserved.    Housework - Reaching Down   If you are unable to bend your knees or squat, use a lazy Manuela Schwartz to keep items within easy reach. Store only light, unbreakable items on the lowest shelves, and use a reacher to pick them up.  Copyright  VHI. All rights reserved.  Low Shelf   Squat down, and bring item close to lift.   Copyright  VHI. All rights reserved.  Lifting Principles .Maintain proper posture and head alignment. .Slide object as close as possible before lifting. .Move obstacles out of the way. .Test before lifting; ask for help if too heavy. .Tighten stomach muscles without holding breath. .Use smooth movements; do not jerk. .Use legs to do the work, and pivot with feet. .Distribute the work load symmetrically and close to the center of trunk. .Push instead of pull whenever possible.  Copyright  VHI. All rights reserved.  Posture - Standing   Good posture is important. Avoid slouching and forward head thrust. Maintain curve in low back and align ears over shoul- ders, hips over ankles.   Copyright  VHI. All rights reserved.   Posture - Sitting   Sit upright, head facing forward. Try using a roll to support lower back. Keep shoulders relaxed, and avoid  rounded back. Keep hips level with knees. Avoid crossing legs for long periods.   Copyright  VHI. All rights reserved.  Ideal Posture Use with figures on 3 (2 of 2): 1.Head erect 2.Chin in 3.Chest and navel aligned 4.Spinal curves maintained 5.Knees relaxed 6.Shoulders and hips aligned 7.Feet slightly apart 8.Toes and arches active 9.Abdomen taut (breathe with diaphragm) 10.Arms at sides Ideal posture is: -pain free. -achieved with practice, mindful interest, and body awareness.  Copyright  VHI. All rights reserved.     Move heavy items one at a time, or move portions of the contents.   Posture Awareness     Stand and check posture: Jut chin, pull back to comfortable position. Tilt pelvis forward, back; be sure back is not swayed. Roll from heels to balls of feet, then distribute your weight evenly. Picture a line through spine pulling you erect. Focus on breathing. Good Posture = Better Breathing. Check ____ times per day.  http://gt2.exer.us/873   Copyright  VHI. All rights reserved.

## 2016-10-16 ENCOUNTER — Ambulatory Visit: Payer: Medicaid Other

## 2016-10-19 ENCOUNTER — Ambulatory Visit (INDEPENDENT_AMBULATORY_CARE_PROVIDER_SITE_OTHER): Payer: Medicaid Other

## 2016-10-19 DIAGNOSIS — Z23 Encounter for immunization: Secondary | ICD-10-CM

## 2016-12-09 ENCOUNTER — Telehealth: Payer: Self-pay | Admitting: Internal Medicine

## 2016-12-09 NOTE — Telephone Encounter (Signed)
We need to work with her coverage, not PA. Xyzal is otc generic now- she should try that. Suggest [facility] can help find it. Can also try otc Clor-Tri-meton

## 2016-12-09 NOTE — Telephone Encounter (Signed)
Spoke with the pt  She states needing PA for Xyzal  She states zyrtec never worked well for her  She does not know what covered alternatives are  Do you want her to try something else OTC, or proceed with doing PA? Please advise thanks

## 2016-12-09 NOTE — Telephone Encounter (Signed)
lmtcb x1 for pt. 

## 2016-12-09 NOTE — Telephone Encounter (Signed)
lmtcb for pt.  

## 2016-12-10 NOTE — Telephone Encounter (Signed)
Spoke with pt. She is aware of CY's response. Nothing further was needed. 

## 2016-12-11 ENCOUNTER — Ambulatory Visit (INDEPENDENT_AMBULATORY_CARE_PROVIDER_SITE_OTHER): Payer: Medicaid Other | Admitting: Internal Medicine

## 2016-12-11 ENCOUNTER — Encounter: Payer: Self-pay | Admitting: Internal Medicine

## 2016-12-11 ENCOUNTER — Other Ambulatory Visit: Payer: Medicaid Other

## 2016-12-11 VITALS — BP 118/72 | HR 95 | Ht 62.0 in | Wt 156.8 lb

## 2016-12-11 DIAGNOSIS — J302 Other seasonal allergic rhinitis: Secondary | ICD-10-CM

## 2016-12-11 DIAGNOSIS — J3089 Other allergic rhinitis: Principal | ICD-10-CM

## 2016-12-11 MED ORDER — CHLORPHENIRAMINE MALEATE 2 MG/5ML PO SYRP: 2.0000 mg | ORAL_SOLUTION | ORAL | 5 refills | Status: DC | PRN

## 2016-12-11 NOTE — Assessment & Plan Note (Signed)
Mild itching may reflect rebound off of antihistamine. We discussed this. I would like also to let her try a different OTC antihistamine-suggested Claritin or Chlor-Trimeton. Plan-allergy profile for reassessment.

## 2016-12-11 NOTE — Patient Instructions (Signed)
Script sent for chlortrimeton syrup for allergy to use when needed  Order lab- Allergy profile    Dx seasonal allergic rhinitis  You can also use otc flonase and claritin or allegra for allergy if needed
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
## 2016-12-14 LAB — RESPIRATORY ALLERGY PROFILE REGION II ~~LOC~~
Allergen, C. Herbarum, M2: <0.1 kU/L
Allergen, Cedar tree, t12: <0.1 kU/L
Allergen, Comm Silver Birch, t9: <0.1 kU/L
Allergen, Cottonwood, t14: <0.1 kU/L
Allergen, Mouse Urine Protein, e78: <0.1 kU/L
Allergen, Mulberry, t76: <0.1 kU/L
Allergen, Oak,t7: <0.1 kU/L
Bermuda Grass: <0.1 kU/L
Box Elder IgE: <0.1 kU/L
Cat Dander: <0.1 kU/L
Cockroach: <0.1 kU/L
Dog Dander: <0.1 kU/L
Elm IgE: <0.1 kU/L
IgE (Immunoglobulin E), Serum: 6 kU/L (ref <115)
Sheep Sorrel IgE: <0.1 kU/L
TIMOTHY GRASS: 0.84 kU/L — AB

## 2017-01-06 ENCOUNTER — Telehealth: Payer: Self-pay | Admitting: Pulmonary Disease

## 2017-01-06 NOTE — Telephone Encounter (Signed)
PA for the chlorpheniramine 2mg /34ml is not covered by her insurance.  The only form covered by insurance is the tablets.  CY please advise. thanks

## 2017-02-19 ENCOUNTER — Telehealth: Payer: Self-pay | Admitting: Internal Medicine

## 2017-02-19 NOTE — Telephone Encounter (Signed)
Offer augmentin 875 mg, # 14, 1 twice daily  Suggest otc squeeze bottle saline sinus rinse

## 2017-02-19 NOTE — Telephone Encounter (Signed)
Patient returned call, patient contact # [phone].Marland Kitchenert

## 2017-02-19 NOTE — Telephone Encounter (Signed)
ACT-mailbox is full, unable to leave vm wcb

## 2017-02-19 NOTE — Telephone Encounter (Signed)
atc pt again to advise of CY recs.  VM is full and unable to leave a message.

## 2017-02-19 NOTE — Telephone Encounter (Signed)
Called and spoke with pt and she stated she has been having head congestion, nasal drainage, non productive cough.  She stated that she has been having at times, her breathing is not right, but feels that it is related to the sinus issues. She has been dealing with this for about 1 month of using otc meds that have not helped.  Pt requesting something to be called in for her.  CY please advise. Thanks.  No Known Allergies   Last ov--12/11/16 Next ov--12/13/17

## 2017-02-19 NOTE — Telephone Encounter (Signed)
ATC x2 phone rang for 41 seconds with no option to leave voicemail.  Wcb.

## 2017-02-22 MED ORDER — AMOXICILLIN-POT CLAVULANATE 875-125 MG PO TABS: 1.0000 | ORAL_TABLET | Freq: Two times a day (BID) | ORAL | 0 refills | Status: DC

## 2017-02-22 NOTE — Telephone Encounter (Signed)
Spoke with pt and she agreed to the medication being called in. The rx as sent to her pharmacy of choice. She also had some concerns about her dymista needing a PA. I do not see where we prescribed this. Pt states her PCP did and I informed her that she can reach out to them in regards to them initiating the PA. She had no further questions. Nothing further is needed

## 2017-03-08 ENCOUNTER — Other Ambulatory Visit: Payer: Self-pay | Admitting: Obstetrics and Gynecology

## 2017-03-08 ENCOUNTER — Other Ambulatory Visit: Payer: Self-pay | Admitting: Internal Medicine

## 2017-03-08 DIAGNOSIS — Z1231 Encounter for screening mammogram for malignant neoplasm of breast: Secondary | ICD-10-CM

## 2017-03-12 ENCOUNTER — Ambulatory Visit: Payer: Medicaid Other

## 2017-04-01 ENCOUNTER — Ambulatory Visit: Payer: Medicaid Other

## 2017-04-09 ENCOUNTER — Telehealth: Payer: Self-pay | Admitting: Internal Medicine

## 2017-04-09 MED ORDER — AZELASTINE HCL 0.1 % NA SOLN: NASAL | 0 refills | Status: DC

## 2017-04-09 MED ORDER — BENZONATATE 100 MG PO CAPS: 100.0000 mg | ORAL_CAPSULE | Freq: Three times a day (TID) | ORAL | 0 refills | Status: DC | PRN

## 2017-04-09 NOTE — Telephone Encounter (Signed)
Pt is aware of CY's recommendations and voiced her understanding. Rx for Astelin and Tessalon has been sent to preferred pharmacy. Nothing further needed.

## 2017-04-09 NOTE — Telephone Encounter (Signed)
Spoke with patient. She stated that she has had post nasal drip since 7/4. The drainage is causing her to have a severe, non-productive cough. Now her throat is irritated. Stated that sometimes the cough is so strong that it causes her chest to hurt. She has tried cough drops and they have not helped.   Pt wishes to use [facility] in [place].   CY, please advise. Thanks!

## 2017-04-09 NOTE — Telephone Encounter (Signed)
Offer Rx Astelin nasal spray     # 1,     1-2 sprays each nostril twice daily as needed for drainage                 Tessalon perles 100 mg,    # 30     1 every 8 hours for cough if needed

## 2017-04-15 ENCOUNTER — Emergency Department (HOSPITAL_COMMUNITY): Payer: Medicaid Other

## 2017-04-15 ENCOUNTER — Telehealth: Payer: Self-pay | Admitting: Internal Medicine

## 2017-04-15 ENCOUNTER — Emergency Department (HOSPITAL_COMMUNITY)
Admission: EM | Admit: 2017-04-15 | Discharge: 2017-04-15 | Disposition: A | Discharge status: Home / Self Care | Payer: Medicaid Other | Attending: Emergency Medicine | Admitting: Emergency Medicine

## 2017-04-15 ENCOUNTER — Encounter (HOSPITAL_COMMUNITY): Payer: Self-pay | Admitting: Emergency Medicine

## 2017-04-15 DIAGNOSIS — R053 Chronic cough: Secondary | ICD-10-CM

## 2017-04-15 DIAGNOSIS — R05 Cough: Secondary | ICD-10-CM | POA: Diagnosis not present

## 2017-04-15 DIAGNOSIS — Z87891 Personal history of nicotine dependence: Secondary | ICD-10-CM | POA: Diagnosis not present

## 2017-04-15 DIAGNOSIS — Z79899 Other long term (current) drug therapy: Secondary | ICD-10-CM | POA: Insufficient documentation

## 2017-04-15 NOTE — ED Triage Notes (Signed)
Pt complaint of productive cough and hoarse voice worsening since this past Saturday.

## 2017-04-15 NOTE — Discharge Instructions (Signed)
Your chest x-ray today did not demonstrate any pneumonia or bronchitis. Please call Dr. Pollie Friar office to schedule a follow-up appointment from today's visit. You may also follow-up with your pulmonologist, Dr. Annamaria Boots. If symptoms worsen, for instance if you develop a fever despite Tylenol, shortness of breath, chest pain, or other new symptoms, please return to the emergency department for reevaluation. Please continue to use your daily medications such as your nasal spray and a cetirizine for symptom control.
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
## 2017-04-15 NOTE — Telephone Encounter (Signed)
ATC pt on two different phones in triage but phone is not ringing. Will try to call back

## 2017-04-16 NOTE — Telephone Encounter (Signed)
Attempted to contact pt. No answer, no option to leave a message. Will try back.  

## 2017-04-16 NOTE — Telephone Encounter (Signed)
ATC. Pt did not answer. No way to leave a VM. Since this was the 3rd call, will close this message.

## 2017-04-19 ENCOUNTER — Encounter: Payer: Self-pay | Admitting: Pulmonary Disease

## 2017-04-19 ENCOUNTER — Ambulatory Visit (INDEPENDENT_AMBULATORY_CARE_PROVIDER_SITE_OTHER): Payer: Medicaid Other | Admitting: Pulmonary Disease

## 2017-04-19 ENCOUNTER — Telehealth: Payer: Self-pay | Admitting: Internal Medicine

## 2017-04-19 VITALS — BP 106/62 | HR 113 | Ht 62.0 in | Wt 155.0 lb

## 2017-04-19 DIAGNOSIS — J301 Allergic rhinitis due to pollen: Secondary | ICD-10-CM | POA: Diagnosis not present

## 2017-04-19 MED ORDER — MONTELUKAST SODIUM 10 MG PO TABS: 10.0000 mg | ORAL_TABLET | Freq: Every day | ORAL | 5 refills | Status: DC

## 2017-04-19 MED ORDER — BENZONATATE 100 MG PO CAPS: 100.0000 mg | ORAL_CAPSULE | Freq: Three times a day (TID) | ORAL | 0 refills | Status: DC | PRN

## 2017-04-19 MED ORDER — AZELASTINE-FLUTICASONE 137-50 MCG/ACT NA SUSP: 2.0000 | Freq: Two times a day (BID) | NASAL | 0 refills | Status: DC

## 2017-04-19 NOTE — Telephone Encounter (Signed)
Pt saw VS today and he wants her seen in 1 week for a follow up. There aren't any available appointments, even with NPs.  CY/KW -  please advise where pt can be seen. Thanks.

## 2017-04-19 NOTE — Addendum Note (Signed)
Addended by: Virl Cagey on: 04/19/2017 03:31 PM   Modules accepted: Orders

## 2017-04-19 NOTE — Patient Instructions (Signed)
Dymista one spray in each nostril twice daily Singulair 10 mg pill nightly Tessalon one pill 3 times daily as needed for cough Sip water when you have urge to cough Salt water gargle twice per day Follow up with Dr. Annamaria Boots or nurse practitioner in 1 week
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
## 2017-04-21 NOTE — Telephone Encounter (Signed)
Spoke with patient-appt scheduled with CY on Thursday 04/29/17 at 4:30pm. Nothing more needed at this time.

## 2017-04-29 ENCOUNTER — Ambulatory Visit: Payer: Medicaid Other | Admitting: Internal Medicine

## 2017-04-29 ENCOUNTER — Telehealth: Payer: Self-pay | Admitting: Internal Medicine

## 2017-04-29 NOTE — Telephone Encounter (Signed)
Katie please advise where pt can be rescheduled. Thanks.

## 2017-04-30 NOTE — Telephone Encounter (Signed)
Pt can be worked in any [place] on CY's schedule. Please be mindful of consults on schedule. Thanks.

## 2017-04-30 NOTE — Telephone Encounter (Signed)
Left message for patient to call back  

## 2017-05-03 NOTE — Telephone Encounter (Signed)
Pt is scheduled to see CY on 8/3 and she stated that she is still coughing but now it is clear.  She stated that she is still taking all of the meds.  She is aware that CY will address at her ov on 8/3 and to continue the meds.

## 2017-05-05 ENCOUNTER — Emergency Department (HOSPITAL_COMMUNITY): Payer: Medicaid Other

## 2017-05-05 ENCOUNTER — Ambulatory Visit (HOSPITAL_COMMUNITY)
Admission: EM | Admit: 2017-05-05 | Discharge: 2017-05-05 | Disposition: A | Discharge status: Nursing Home (Includes ICF) | Payer: Medicaid Other | Attending: Family Medicine | Admitting: Family Medicine

## 2017-05-05 ENCOUNTER — Encounter (HOSPITAL_COMMUNITY): Payer: Self-pay | Admitting: Emergency Medicine

## 2017-05-05 ENCOUNTER — Emergency Department (HOSPITAL_COMMUNITY)
Admission: EM | Admit: 2017-05-05 | Discharge: 2017-05-05 | Disposition: A | Discharge status: Home / Self Care | Payer: Medicaid Other | Attending: Emergency Medicine | Admitting: Emergency Medicine

## 2017-05-05 ENCOUNTER — Encounter (HOSPITAL_COMMUNITY): Payer: Self-pay | Admitting: *Deleted

## 2017-05-05 DIAGNOSIS — K047 Periapical abscess without sinus: Secondary | ICD-10-CM | POA: Insufficient documentation

## 2017-05-05 DIAGNOSIS — L089 Local infection of the skin and subcutaneous tissue, unspecified: Secondary | ICD-10-CM | POA: Diagnosis not present

## 2017-05-05 DIAGNOSIS — R29810 Facial weakness: Secondary | ICD-10-CM

## 2017-05-05 DIAGNOSIS — Z87828 Personal history of other (healed) physical injury and trauma: Secondary | ICD-10-CM | POA: Insufficient documentation

## 2017-05-05 DIAGNOSIS — Z87891 Personal history of nicotine dependence: Secondary | ICD-10-CM | POA: Diagnosis not present

## 2017-05-05 DIAGNOSIS — R220 Localized swelling, mass and lump, head: Secondary | ICD-10-CM

## 2017-05-05 DIAGNOSIS — R600 Localized edema: Secondary | ICD-10-CM | POA: Diagnosis present

## 2017-05-05 DIAGNOSIS — Z8673 Personal history of transient ischemic attack (TIA), and cerebral infarction without residual deficits: Secondary | ICD-10-CM | POA: Diagnosis not present

## 2017-05-05 DIAGNOSIS — Z79899 Other long term (current) drug therapy: Secondary | ICD-10-CM | POA: Diagnosis not present

## 2017-05-05 DIAGNOSIS — I6930 Unspecified sequelae of cerebral infarction: Secondary | ICD-10-CM | POA: Diagnosis not present

## 2017-05-05 MED ORDER — IBUPROFEN 600 MG PO TABS: 600.0000 mg | ORAL_TABLET | Freq: Four times a day (QID) | ORAL | 0 refills | Status: DC | PRN

## 2017-05-05 MED ORDER — PREDNISONE 50 MG PO TABS: 50.0000 mg | ORAL_TABLET | Freq: Every day | ORAL | 0 refills | Status: DC

## 2017-05-05 MED ORDER — IBUPROFEN 800 MG PO TABS
800.0000 mg | ORAL_TABLET | Freq: Once | ORAL | Status: AC
  Administered 2017-05-05: 800 mg via ORAL
  Filled 2017-05-05: qty 1

## 2017-05-05 MED ORDER — HYDROCODONE-ACETAMINOPHEN 5-325 MG PO TABS
2.0000 | ORAL_TABLET | Freq: Once | ORAL | Status: DC
  Filled 2017-05-05: qty 2

## 2017-05-05 MED ORDER — AMOXICILLIN-POT CLAVULANATE 875-125 MG PO TABS: 1.0000 | ORAL_TABLET | Freq: Two times a day (BID) | ORAL | 0 refills | Status: DC

## 2017-05-05 MED ORDER — HYDROCODONE-ACETAMINOPHEN 5-325 MG PO TABS: 1.0000 | ORAL_TABLET | ORAL | 0 refills | Status: DC | PRN
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
## 2017-05-05 NOTE — ED Notes (Signed)
Patient transported to CT 

## 2017-05-05 NOTE — Discharge Instructions (Signed)
Take antibiotics as prescribed. See your family doctor for recheck within 2-4 days. Make an appointment with her dentist as soon as possible.
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
## 2017-05-05 NOTE — ED Notes (Signed)
E-signature not available, pt verbalized understanding of DC instructions and prescriptions 

## 2017-05-05 NOTE — ED Triage Notes (Addendum)
Cough, loss of voice, allergy symptoms.  Phlegm is clear now.   Patient noticed symptoms on Sunday 7/29 Today left side of face soreness and throbbing.  patient reports left facial residual from a stroke. Left facial drooping is present, unsure what is new and what is old
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
## 2017-05-05 NOTE — Discharge Instructions (Signed)
Please go to the ED to be evaluated.

## 2017-05-07 ENCOUNTER — Ambulatory Visit: Payer: Medicaid Other | Admitting: Internal Medicine

## 2017-05-13 ENCOUNTER — Telehealth: Payer: Self-pay | Admitting: Internal Medicine

## 2017-05-13 ENCOUNTER — Ambulatory Visit (INDEPENDENT_AMBULATORY_CARE_PROVIDER_SITE_OTHER): Payer: Medicaid Other | Admitting: Internal Medicine

## 2017-05-13 ENCOUNTER — Encounter: Payer: Self-pay | Admitting: Internal Medicine

## 2017-05-13 VITALS — BP 128/82 | HR 96 | Ht 66.0 in | Wt 154.0 lb

## 2017-05-13 DIAGNOSIS — J209 Acute bronchitis, unspecified: Secondary | ICD-10-CM

## 2017-05-13 DIAGNOSIS — J45909 Unspecified asthma, uncomplicated: Secondary | ICD-10-CM | POA: Insufficient documentation

## 2017-05-13 LAB — NITRIC OXIDE: NITRIC OXIDE: 53

## 2017-05-13 MED ORDER — PREDNISONE 10 MG PO TABS: ORAL_TABLET | ORAL | 0 refills | Status: DC

## 2017-05-13 MED ORDER — MOMETASONE FUROATE 100 MCG/ACT IN AERO: 2.0000 | INHALATION_SPRAY | Freq: Two times a day (BID) | RESPIRATORY_TRACT | 0 refills | Status: DC

## 2017-05-13 NOTE — Addendum Note (Signed)
Addended by: Mathis Bud on: 05/13/2017 12:19 PM   Modules accepted: Orders

## 2017-05-13 NOTE — Telephone Encounter (Signed)
Fine with me

## 2017-05-13 NOTE — Patient Instructions (Addendum)
ICD-10-CM   1. Acute bronchitis, unspecified organism J20.9   2. Acute asthma J45.909     Please take prednisone 40 mg x1 day, then 30 mg x1 day, then 20 mg x1 day, then 10 mg x1 day, and then 5 mg x1 day and stop  Start inhaled steroid Asmanex/Pulmicort/Arunity - daily scheduled (as approved by insurance)  Albuterol as needed  You just completed 7 day augmentin - so I want to hold off on new antibitics  Continue your other medications as before  Followup Per Dr Annamaria Boots scheduled - next few months for sure

## 2017-05-13 NOTE — Addendum Note (Signed)
Addended by: Collier Salina on: 05/13/2017 12:30 PM   Modules accepted: Orders

## 2017-05-13 NOTE — Telephone Encounter (Signed)
MR please advise if you're willing to take on this patient.  Thanks!  
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
## 2017-05-13 NOTE — Telephone Encounter (Signed)
Spoke with pt. She is wanting switch from CY to MR.  CY - please advise if this change is okay. Thanks.

## 2017-05-13 NOTE — Addendum Note (Signed)
Addended by: Collier Salina on: 05/13/2017 01:19 PM   Modules accepted: Orders

## 2017-05-14 NOTE — Telephone Encounter (Signed)
Called and spoke with pt and she is aware to call back for any issues.  She will call back in dec to get an appt set up with MR.  Nothing further is needed.

## 2017-05-14 NOTE — Telephone Encounter (Signed)
That will be fine  Dr. Brand Males, M.D., [facility].C.P Pulmonary and Critical Care Medicine Staff Physician [place] [facility] Pager: [phone], If no answer or between  15:00h - 7:00h: call [phone]  05/14/2017 10:31 AM

## 2017-05-28 ENCOUNTER — Ambulatory Visit
Admission: RE | Admit: 2017-05-28 | Discharge: 2017-05-28 | Disposition: A | Discharge status: Home / Self Care | Payer: Medicaid Other | Source: Ambulatory Visit | Attending: Internal Medicine | Admitting: Internal Medicine

## 2017-05-28 ENCOUNTER — Telehealth: Payer: Self-pay | Admitting: Internal Medicine

## 2017-05-28 DIAGNOSIS — Z1231 Encounter for screening mammogram for malignant neoplasm of breast: Secondary | ICD-10-CM

## 2017-05-28 NOTE — Telephone Encounter (Signed)
lmomtcb x 1 for the pt to return our call.

## 2017-05-31 NOTE — Telephone Encounter (Signed)
Left message for patient to call back  

## 2017-05-31 NOTE — Telephone Encounter (Signed)
Needs ov per MW

## 2017-05-31 NOTE — Telephone Encounter (Signed)
LM x 2

## 2017-05-31 NOTE — Telephone Encounter (Signed)
MW please advise.  Thanks.  

## 2017-05-31 NOTE — Telephone Encounter (Signed)
Pt states with her last flare she only took Prednisone taper.  Pt states that the abx was for a tooth abscess -  Does not want an abx at this time.  Pred Taper - Sig: 40 mg x1 day, then 30 mg x1 day, then 20 mg x1 day, then 10 mg x1 day, and then 5 mg x1 day and stop  Please advise Dr Melvyn Novas. Thanks.

## 2017-05-31 NOTE — Telephone Encounter (Signed)
Spoke with patient. She saw MR on 05/13/17 and was prescribed a prednisone taper for her cough. MR at the time did not feel an abx was appropriate. She is calling back to state that she has finished the taper and she is coughing up yellow phlegm. Denies any SOB, wheezing, or fever.   She wishes to use [facility] on [address].   TP, please advise since MR is not in the office today. Thanks!

## 2017-05-31 NOTE — Telephone Encounter (Signed)
Pt returning call from Friday.-tr

## 2017-05-31 NOTE — Telephone Encounter (Signed)
She had augmentin rx 8/1 so if feels it helped give it x 10 days this time - if didn't help much, then just zpak

## 2017-06-02 ENCOUNTER — Other Ambulatory Visit (INDEPENDENT_AMBULATORY_CARE_PROVIDER_SITE_OTHER): Payer: Medicaid Other

## 2017-06-02 ENCOUNTER — Encounter: Payer: Self-pay | Admitting: Internal Medicine

## 2017-06-02 ENCOUNTER — Ambulatory Visit (INDEPENDENT_AMBULATORY_CARE_PROVIDER_SITE_OTHER): Payer: Medicaid Other | Admitting: Internal Medicine

## 2017-06-02 VITALS — BP 110/60 | HR 83 | Ht 70.0 in | Wt 157.6 lb

## 2017-06-02 DIAGNOSIS — R05 Cough: Secondary | ICD-10-CM | POA: Diagnosis not present

## 2017-06-02 DIAGNOSIS — R058 Other specified cough: Secondary | ICD-10-CM

## 2017-06-02 LAB — CBC WITH DIFFERENTIAL/PLATELET
BASOS PCT: 1 % (ref 0.0–3.0)
Basophils Absolute: 0.1 10*3/uL (ref 0.0–0.1)
EOS PCT: 2.7 % (ref 0.0–5.0)
Eosinophils Absolute: 0.1 10*3/uL (ref 0.0–0.7)
HEMATOCRIT: 35.9 % — AB (ref 36.0–46.0)
HEMOGLOBIN: 11.7 g/dL — AB (ref 12.0–15.0)
LYMPHS PCT: 42.5 % (ref 12.0–46.0)
Lymphs Abs: 2.2 10*3/uL (ref 0.7–4.0)
MCHC: 32.6 g/dL (ref 30.0–36.0)
MCV: 85.3 fl (ref 78.0–100.0)
Monocytes Absolute: 0.5 10*3/uL (ref 0.1–1.0)
Monocytes Relative: 10.5 % (ref 3.0–12.0)
NEUTROS ABS: 2.2 10*3/uL (ref 1.4–7.7)
Neutrophils Relative %: 43.3 % (ref 43.0–77.0)
PLATELETS: 236 10*3/uL (ref 150.0–400.0)
RBC: 4.2 Mil/uL (ref 3.87–5.11)
RDW: 15.5 % (ref 11.5–15.5)
WBC: 5.2 10*3/uL (ref 4.0–10.5)

## 2017-06-02 MED ORDER — PREDNISONE 10 MG PO TABS: ORAL_TABLET | ORAL | 0 refills | Status: DC

## 2017-06-02 MED ORDER — AZELASTINE-FLUTICASONE 137-50 MCG/ACT NA SUSP: NASAL | 11 refills | Status: DC

## 2017-06-02 MED ORDER — FAMOTIDINE 20 MG PO TABS: ORAL_TABLET | ORAL | Status: DC

## 2017-06-02 NOTE — Telephone Encounter (Signed)
lmtcb x2 for pt. 

## 2017-06-02 NOTE — Telephone Encounter (Signed)
Pt returned Margie's call # is [phone]

## 2017-06-02 NOTE — Patient Instructions (Addendum)
Prednisone 10 mg take  4 each am x 2 days,   2 each am x 2 days,  1 each am x 2 days and stop   Stop your asmanex   dymista one twice daily   For drainage / throat tickle try take CHLORPHENIRAMINE  4 mg - take one every 4 hours as needed - available over the counter- may cause drowsiness so start with just a bedtime dose or two and see how you tolerate it before trying in daytime    Add pepcid 20 mg at bedtime you return   GERD (REFLUX)  is an extremely common cause of respiratory symptoms just like yours , many times with no obvious heartburn at all.    It can be treated with medication, but also with lifestyle changes including elevation of the head of your bed (ideally with 6 inch  bed blocks),  Smoking cessation, avoidance of late meals, excessive alcohol, and avoid fatty foods, chocolate, peppermint, colas, red wine, and acidic juices such as orange juice.  NO MINT OR MENTHOL PRODUCTS SO NO COUGH DROPS  USE SUGARLESS CANDY INSTEAD (Jolley ranchers or Stover's or Life Savers) or even ice chips will also do - the key is to swallow to prevent all throat clearing. NO OIL BASED VITAMINS - use powdered substitutes.   Please remember to go to the lab department downstairs in the basement  for your tests - we will call you with the results when they are available.  If not better return here with all medications in hand

## 2017-06-02 NOTE — Telephone Encounter (Signed)
Pt scheduled for OV with MW today at 345. Nothing further needed.
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
## 2017-06-03 LAB — RESPIRATORY ALLERGY PROFILE REGION II ~~LOC~~
Allergen, C. Herbarum, M2: <0.1 kU/L
Allergen, Comm Silver Birch, t9: <0.1 kU/L
Allergen, Cottonwood, t14: <0.1 kU/L
Allergen, D pternoyssinus,d7: <0.1 kU/L
Allergen, Mulberry, t76: <0.1 kU/L
Allergen, P. notatum, m1: <0.1 kU/L
Aspergillus fumigatus, m3: <0.1 kU/L
Bermuda Grass: <0.1 kU/L
Cockroach: <0.1 kU/L
Common Ragweed: <0.1 kU/L
D. farinae: <0.1 kU/L
Dog Dander: <0.1 kU/L
IGE (IMMUNOGLOBULIN E), SERUM: 7 kU/L (ref <115)
Johnson Grass: <0.1 kU/L
Pecan/Hickory Tree IgE: <0.1 kU/L
Timothy Grass: 0.97 kU/L — ABNORMAL HIGH

## 2017-06-03 NOTE — Progress Notes (Signed)
LMTCB
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
## 2017-06-04 NOTE — Progress Notes (Signed)
LMTCB

## 2017-06-11 ENCOUNTER — Telehealth: Payer: Self-pay | Admitting: Internal Medicine

## 2017-06-11 MED ORDER — AZELASTINE-FLUTICASONE 137-50 MCG/ACT NA SUSP: NASAL | 11 refills | Status: DC

## 2017-06-11 NOTE — Progress Notes (Signed)
See phone note 06/11/17

## 2017-06-11 NOTE — Telephone Encounter (Signed)
Tanda Rockers, MD sent to [facility], [place]        Call patient : Studies are minimal allergy only to Timothy grass so doubt it explains any of her symoptoms > so no change rx    lmtcb

## 2017-06-11 NOTE — Telephone Encounter (Signed)
Pt called back, aware of results/recs.  Nothing further needed.  

## 2017-06-16 ENCOUNTER — Telehealth: Payer: Self-pay | Admitting: Internal Medicine

## 2017-06-16 MED ORDER — AZELASTINE HCL 0.1 % NA SOLN: 1.0000 | Freq: Two times a day (BID) | NASAL | 12 refills | Status: DC

## 2017-06-16 MED ORDER — FLUTICASONE PROPIONATE 50 MCG/ACT NA SUSP: 2.0000 | Freq: Every day | NASAL | 2 refills | Status: DC

## 2017-06-16 NOTE — Telephone Encounter (Signed)
Insurance does not cover the dymista---this has been changed to the fluticasone and astelin.  These have been sent to the pharmacy.

## 2017-07-02 ENCOUNTER — Ambulatory Visit: Payer: Self-pay

## 2017-07-12 ENCOUNTER — Ambulatory Visit (INDEPENDENT_AMBULATORY_CARE_PROVIDER_SITE_OTHER): Payer: Medicaid Other

## 2017-07-12 DIAGNOSIS — Z23 Encounter for immunization: Secondary | ICD-10-CM | POA: Diagnosis not present

## 2017-09-08 ENCOUNTER — Telehealth: Payer: Self-pay | Admitting: Internal Medicine

## 2017-09-08 MED ORDER — DOXYCYCLINE HYCLATE 100 MG PO TABS: 100.0000 mg | ORAL_TABLET | Freq: Two times a day (BID) | ORAL | 0 refills | Status: DC

## 2017-09-08 MED ORDER — PREDNISONE 10 MG PO TABS: ORAL_TABLET | ORAL | 0 refills | Status: DC

## 2017-09-08 NOTE — Telephone Encounter (Signed)
lmtcb x1 for pt. 

## 2017-09-08 NOTE — Telephone Encounter (Signed)
Your recommendations are fine.

## 2017-09-08 NOTE — Telephone Encounter (Signed)
Spoke with the pt  She believes that she has a sinus infection  She is c/o facial pressure, HA,  PND, and non prod cough No f/c/s, SOB, wheezing  She is asking for something to be called in  Please advise thanks! No Known Allergies Current Outpatient Medications on File Prior to Visit  Medication Sig Dispense Refill  . azelastine (ASTELIN) 0.1 % nasal spray Place 1 spray into both nostrils 2 (two) times daily. Use in each nostril as directed 30 mL 12  . Azelastine-Fluticasone (DYMISTA) 137-50 MCG/ACT SUSP One twice daily 6 g 11  . famotidine (PEPCID) 20 MG tablet One at bedtime    . fluticasone (FLONASE) 50 MCG/ACT nasal spray Place 2 sprays into both nostrils daily. 16 g 2  . ibuprofen (ADVIL,MOTRIN) 600 MG tablet Take 1 tablet (600 mg total) by mouth every 6 (six) hours as needed. 30 tablet 0  . LORazepam (ATIVAN) 1 MG tablet Take 1 tablet (1 mg total) by mouth daily. 4 tablet 0  . lubiprostone (AMITIZA) 24 MCG capsule Take 24 mcg by mouth 2 (two) times daily with a meal.    . phentermine 37.5 MG capsule Take 37.5 mg by mouth every morning.    . predniSONE (DELTASONE) 10 MG tablet Take  4 each am x 2 days,   2 each am x 2 days,  1 each am x 2 days and stop 14 tablet 0   No current facility-administered medications on file prior to visit.

## 2017-09-08 NOTE — Telephone Encounter (Signed)
Called spoke with patient, informed her of MR's recommendations as stated below Pt voiced her understanding - Rx's sent to verified pharmacy  While speaking with patient, she had an additional question/concern:  Pt stated that after completing her courses of treatment for sick symptoms, she always has a lingering cough/congestion that tends to lead into bronchitis.  She is wondering what can be done to prevent this.  Did discuss with patient 1- avoidance if she is aware of family/aquaintance(s) that are sick, 2- good hand hygiene, 3- covering her mouth when outside in the cold/wind/high allergen days, 4- if/when she notices that her symptoms are progressing, to call the office sooner rather than later.  Actually, I think this patient belongs to [facility] but saw MR last for an acute visit. Dr Annamaria Boots, are there any additional recommendations for the patient?  Thank you so much!  **per pt, okay to leave detailed message

## 2017-09-08 NOTE — Telephone Encounter (Signed)
Take doxycycline 100mg  po twice daily x 7 days; take after meals and avoid sunlight and cannot be pregnant  Please take prednisone 40 mg x1 day, then 30 mg x1 day, then 20 mg x1 day, then 10 mg x1 day, and then 5 mg x1 day and stop   Dr. Brand Males, M.D., [facility].C.P Pulmonary and Critical Care Medicine Staff Physician, [place] Director - Interstitial Lung Disease  Program  P[facility] [place] [facility] [place], [place], [address]  Pager: [phone], If no answer or between  15:00h - 7:00h: call [phone] Telephone: [phone]

## 2017-09-09 NOTE — Telephone Encounter (Signed)
Called and spoke with pt and she is aware of CY recs.  Nothing further is needed.  

## 2017-09-15 ENCOUNTER — Telehealth: Payer: Self-pay | Admitting: *Deleted

## 2017-09-15 NOTE — Telephone Encounter (Signed)
PA initiated for Dymista thru [facility] [employer]. Patient has tried Astelin and Flonase. Pending decision.

## 2017-09-17 NOTE — Telephone Encounter (Signed)
PA for Dymista approved 09/16/17-- 09/11/18. PA [id number]  Approval will be faxed to [employer] and mailed to patient. Nothing furtter needed.

## 2017-12-13 ENCOUNTER — Ambulatory Visit: Payer: Medicaid Other | Admitting: Internal Medicine

## 2018-02-17 ENCOUNTER — Ambulatory Visit: Payer: Medicaid Other | Admitting: Internal Medicine

## 2018-02-18 ENCOUNTER — Telehealth: Payer: Self-pay | Admitting: Internal Medicine

## 2018-02-18 ENCOUNTER — Encounter: Payer: Self-pay | Admitting: Internal Medicine

## 2018-02-18 ENCOUNTER — Ambulatory Visit (INDEPENDENT_AMBULATORY_CARE_PROVIDER_SITE_OTHER): Payer: Medicaid Other | Admitting: Internal Medicine

## 2018-02-18 VITALS — BP 122/88 | HR 109 | Temp 98.8°F | Ht 70.0 in | Wt 158.0 lb

## 2018-02-18 DIAGNOSIS — R05 Cough: Secondary | ICD-10-CM | POA: Diagnosis not present

## 2018-02-18 DIAGNOSIS — R058 Other specified cough: Secondary | ICD-10-CM

## 2018-02-18 MED ORDER — PREDNISONE 10 MG PO TABS: ORAL_TABLET | ORAL | 0 refills | Status: DC

## 2018-02-18 MED ORDER — AZITHROMYCIN 250 MG PO TABS: ORAL_TABLET | ORAL | 0 refills | Status: DC

## 2018-02-18 MED ORDER — OMEPRAZOLE MAGNESIUM 20 MG PO TBEC: DELAYED_RELEASE_TABLET | ORAL | Status: DC

## 2018-02-18 NOTE — Telephone Encounter (Signed)
Called and spoke to patient. Patient stated she continues to not feel well and has been having a productive cough with yellow colored mucous, hoarseness, and sore throat. Patient denies fever. Patient scheduled for acute visit with MW today at 11:45. Nothing further is needed at this time.

## 2018-02-18 NOTE — Patient Instructions (Addendum)
Prednisone 10 mg take  4 each am x 2 days,   2 each am x 2 days,  1 each am x 2 days and stop   Stop your asmanex   zpak    For drainage / throat tickle try take CHLORPHENIRAMINE  4 mg - take one every 4 hours as needed - available over the counter- may cause drowsiness so start with just a bedtime dose or two and see how you tolerate it before trying in daytime    For cough > take deslym 2 tsp every 12 hour and add Prilosec otc 20mg   Take 30-60 min before first meal of the day and Pepcid ac (famotidine) 20 mg one @  bedtime until cough is completely gone for at least a week without the need for cough suppression   GERD (REFLUX)  is an extremely common cause of respiratory symptoms just like yours , many times with no obvious heartburn at all.    It can be treated with medication, but also with lifestyle changes including elevation of the head of your bed (ideally with 6 inch  bed blocks),  Smoking cessation, avoidance of late meals, excessive alcohol, and avoid fatty foods, chocolate, peppermint, colas, red wine, and acidic juices such as orange juice.  NO MINT OR MENTHOL PRODUCTS SO NO COUGH DROPS   USE SUGARLESS CANDY INSTEAD (Jolley ranchers or Stover's or Life Savers) or even ice chips will also do - the key is to swallow to prevent all throat clearing. NO OIL BASED VITAMINS - use powdered substitutes.     If not better return here with all medications in hand
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
## 2018-02-19 ENCOUNTER — Encounter: Payer: Self-pay | Admitting: Internal Medicine
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
## 2018-04-29 ENCOUNTER — Encounter (HOSPITAL_COMMUNITY): Payer: Self-pay

## 2018-04-29 ENCOUNTER — Ambulatory Visit (HOSPITAL_COMMUNITY)
Admission: EM | Admit: 2018-04-29 | Discharge: 2018-04-29 | Disposition: A | Discharge status: Home / Self Care | Payer: Medicaid Other | Attending: Family Medicine | Admitting: Family Medicine

## 2018-04-29 DIAGNOSIS — Z760 Encounter for issue of repeat prescription: Secondary | ICD-10-CM

## 2018-04-29 DIAGNOSIS — F419 Anxiety disorder, unspecified: Secondary | ICD-10-CM

## 2018-04-29 MED ORDER — LORAZEPAM 2 MG PO TABS: ORAL_TABLET | ORAL | 0 refills | Status: DC

## 2018-04-29 NOTE — Discharge Instructions (Signed)
Call your PCP in one week

## 2018-04-29 NOTE — ED Triage Notes (Signed)
Pt presents with needing her rx for lorazepam refilled. Recently lost her father and is having anxiety from it. Her primary doctor is out of town.
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
## 2018-05-09 ENCOUNTER — Other Ambulatory Visit: Payer: Self-pay | Admitting: Family Medicine

## 2018-05-20 ENCOUNTER — Telehealth: Payer: Self-pay | Admitting: Internal Medicine

## 2018-05-20 MED ORDER — PREDNISONE 10 MG PO TABS: ORAL_TABLET | ORAL | 0 refills | Status: DC

## 2018-05-20 MED ORDER — DOXYCYCLINE HYCLATE 100 MG PO TABS: 100.0000 mg | ORAL_TABLET | Freq: Two times a day (BID) | ORAL | 0 refills | Status: DC

## 2018-05-20 NOTE — Telephone Encounter (Signed)
Spoke with pt. She is aware of MR's response. Rxs have been sent in. Nothing further was needed. 

## 2018-05-20 NOTE — Telephone Encounter (Signed)
Do   Take doxycycline 100mg  po twice daily x 7  days; take after meals and avoid sunlight  Do Take prednisone 40 mg daily x 2 days, then 20mg  daily x 2 days, then 10mg  daily x 2 days, then 5mg  daily x 2 days and stop

## 2018-05-20 NOTE — Telephone Encounter (Signed)
Attempted to call pt. I did not receive an answer. I have left a message for pt to return our call.  

## 2018-05-20 NOTE — Telephone Encounter (Signed)
Spoke with pt, she is having a lot of drainage, headache, pressure in sinus, trying to cough up mucus but she cannot. She only used her nasal spray but no relief. Denies chest pain, fever, but states she has waited 2 weeks for symptoms to subside but now it is getting worse. She states MR will know what she needs when she gets an episode of these symptoms. Please advise.   [facility]  Current Outpatient Medications on File Prior to Visit  Medication Sig Dispense Refill  . azithromycin (ZITHROMAX) 250 MG tablet Take 2 on day one then 1 daily x 4 days 6 tablet 0  . famotidine (PEPCID) 20 MG tablet One at bedtime    . ibuprofen (ADVIL,MOTRIN) 600 MG tablet Take 1 tablet (600 mg total) by mouth every 6 (six) hours as needed. 30 tablet 0  . LORazepam (ATIVAN) 2 MG tablet Take one a day 10 tablet 0  . lubiprostone (AMITIZA) 24 MCG capsule Take 24 mcg by mouth 2 (two) times daily with a meal.    . omeprazole (PRILOSEC OTC) 20 MG tablet Take 30-60 min before first meal of the day as long as coughing or hoarse    . phentermine 37.5 MG capsule Take 37.5 mg by mouth every morning.    . predniSONE (DELTASONE) 10 MG tablet Take  4 each am x 2 days,   2 each am x 2 days,  1 each am x 2 days and stop 14 tablet 0   No current facility-administered medications on file prior to visit.    No Known Allergies

## 2018-05-20 NOTE — Telephone Encounter (Signed)
Pt is calling back [phone]

## 2018-06-10 ENCOUNTER — Telehealth: Payer: Self-pay | Admitting: Internal Medicine

## 2018-06-10 NOTE — Telephone Encounter (Signed)
Spoke with pt. States that she is still having issues with sinus drainage and coughing. We have not seen her since May. Advised her that she would need an OV. Pt has been scheduled to see Aaron Edelman on 06/13/18 at 12pm. Nothing further was needed at this time.
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
## 2018-06-13 ENCOUNTER — Ambulatory Visit: Payer: Medicaid Other | Admitting: Pulmonary Disease
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
## 2018-06-14 ENCOUNTER — Encounter: Payer: Self-pay | Admitting: Pulmonary Disease

## 2018-06-14 ENCOUNTER — Ambulatory Visit (INDEPENDENT_AMBULATORY_CARE_PROVIDER_SITE_OTHER): Payer: Medicaid Other | Admitting: Pulmonary Disease

## 2018-06-14 VITALS — BP 120/70 | HR 86 | Ht 65.0 in | Wt 168.6 lb

## 2018-06-14 DIAGNOSIS — R05 Cough: Secondary | ICD-10-CM

## 2018-06-14 DIAGNOSIS — Z23 Encounter for immunization: Secondary | ICD-10-CM

## 2018-06-14 DIAGNOSIS — R058 Other specified cough: Secondary | ICD-10-CM

## 2018-06-14 LAB — NITRIC OXIDE: Nitric Oxide: 22

## 2018-06-14 NOTE — Assessment & Plan Note (Addendum)
FENO today   Continue Dymista Can also do nasal saline rinses   Continue Zyrtec daily   Can start Chlorphenhydramine tabs 4mg  2 at bedtime  for nasal drip until cough is 100% cough free.   Flu vaccine today   49-month follow-up with Dr. Melvyn Novas with all medications and hand >>>Please bring all of your medications this appointment so they can be reviewed at that time

## 2018-06-14 NOTE — Progress Notes (Signed)
Discussed results with patient in office.  Nothing further is needed at this time.  Brian Mack FNP  

## 2018-06-14 NOTE — Progress Notes (Signed)
Chart and office note reviewed in detail  > agree with a/p as outlined    

## 2018-06-14 NOTE — Patient Instructions (Addendum)
FENO today   Continue Dymista   Can also do nasal saline rinses   Continue Zyrtec daily   Can start Chlorphenhydramine tabs 4mg  2 at bedtime  for nasal drip until cough is 100% cough free.   Flu vaccine today   61-month follow-up with Dr. Melvyn Novas with all medications and hand >>>Please bring all of your medications this appointment so they can be reviewed at that time      It is flu season:   >>>Remember to be washing your hands regularly, using hand sanitizer, be careful to use around herself with has contact with people who are sick will increase her chances of getting sick yourself. >>> Best ways to protect herself from the flu: Receive the yearly flu vaccine, practice good hand hygiene washing with soap and also using hand sanitizer when available, eat a nutritious meals, get adequate rest, hydrate appropriately   Please contact the office if your symptoms worsen or you have concerns that you are not improving.   Thank you for choosing [facility] for your healthcare, and for allowing [place] to partner with you on your healthcare journey. I am thankful to be able to provide care to you today.   Wyn Quaker FNP-C  Influenza Virus Vaccine injection What is this medicine? INFLUENZA VIRUS VACCINE (in floo EN zuh VAHY ruhs vak SEEN) helps to reduce the risk of getting influenza also known as the flu. The vaccine only helps protect you against some strains of the flu. This medicine may be used for other purposes; ask your health care provider or pharmacist if you have questions. COMMON BRAND NAME(S): Afluria, Agriflu, Alfuria, FLUAD, Fluarix, Fluarix Quadrivalent, Flublok, Flublok Quadrivalent, FLUCELVAX, Flulaval, Fluvirin, Fluzone, Fluzone High-Dose, Fluzone Intradermal What should I tell my health care provider before I take this medicine? They need to know if you have any of these conditions: -bleeding disorder like hemophilia -fever or infection -Guillain-Barre syndrome or  other neurological problems -immune system problems -infection with the human immunodeficiency virus (HIV) or AIDS -low blood platelet counts -multiple sclerosis -an unusual or allergic reaction to influenza virus vaccine, latex, other medicines, foods, dyes, or preservatives. Different brands of vaccines contain different allergens. Some may contain latex or eggs. Talk to your doctor about your allergies to make sure that you get the right vaccine. -pregnant or trying to get pregnant -breast-feeding How should I use this medicine? This vaccine is for injection into a muscle or under the skin. It is given by a health care professional. A copy of Vaccine Information Statements will be given before each vaccination. Read this sheet carefully each time. The sheet may change frequently. Talk to your healthcare provider to see which vaccines are right for you. Some vaccines should not be used in all age groups. Overdosage: If you think you have taken too much of this medicine contact a poison control center or emergency room at once. NOTE: This medicine is only for you. Do not share this medicine with others. What if I miss a dose? This does not apply. What may interact with this medicine? -chemotherapy or radiation therapy -medicines that lower your immune system like etanercept, anakinra, infliximab, and adalimumab -medicines that treat or prevent blood clots like warfarin -phenytoin -steroid medicines like prednisone or cortisone -theophylline -vaccines This list may not describe all possible interactions. Give your health care provider a list of all the medicines, herbs, non-prescription drugs, or dietary supplements you use. Also tell them if you smoke, drink alcohol, or use  illegal drugs. Some items may interact with your medicine. What should I watch for while using this medicine? Report any side effects that do not go away within 3 days to your doctor or health care professional. Call  your health care provider if any unusual symptoms occur within 6 weeks of receiving this vaccine. You may still catch the flu, but the illness is not usually as bad. You cannot get the flu from the vaccine. The vaccine will not protect against colds or other illnesses that may cause fever. The vaccine is needed every year. What side effects may I notice from receiving this medicine? Side effects that you should report to your doctor or health care professional as soon as possible: -allergic reactions like skin rash, itching or hives, swelling of the face, lips, or tongue Side effects that usually do not require medical attention (report to your doctor or health care professional if they continue or are bothersome): -fever -headache -muscle aches and pains -pain, tenderness, redness, or swelling at the injection site -tiredness This list may not describe all possible side effects. Call your doctor for medical advice about side effects. You may report side effects to FDA at 1-800-FDA-1088. Where should I keep my medicine? The vaccine will be given by a health care professional in a clinic, pharmacy, doctor's office, or other health care setting. You will not be given vaccine doses to store at home. NOTE: This sheet is a summary. It may not cover all possible information. If you have questions about this medicine, talk to your doctor, pharmacist, or health care provider.  2018 Elsevier/Gold Standard (2015-04-12 10:07:28)

## 2018-06-25 ENCOUNTER — Emergency Department (HOSPITAL_COMMUNITY): Payer: Medicaid Other

## 2018-06-25 ENCOUNTER — Encounter (HOSPITAL_COMMUNITY): Payer: Self-pay | Admitting: *Deleted

## 2018-06-25 ENCOUNTER — Other Ambulatory Visit: Payer: Self-pay

## 2018-06-25 ENCOUNTER — Emergency Department (HOSPITAL_COMMUNITY)
Admission: EM | Admit: 2018-06-25 | Discharge: 2018-06-25 | Disposition: A | Discharge status: Home / Self Care | Payer: Medicaid Other | Attending: Emergency Medicine | Admitting: Emergency Medicine

## 2018-06-25 DIAGNOSIS — X500XXA Overexertion from strenuous movement or load, initial encounter: Secondary | ICD-10-CM | POA: Diagnosis not present

## 2018-06-25 DIAGNOSIS — Y929 Unspecified place or not applicable: Secondary | ICD-10-CM | POA: Insufficient documentation

## 2018-06-25 DIAGNOSIS — Y999 Unspecified external cause status: Secondary | ICD-10-CM | POA: Diagnosis not present

## 2018-06-25 DIAGNOSIS — S93402A Sprain of unspecified ligament of left ankle, initial encounter: Secondary | ICD-10-CM | POA: Diagnosis not present

## 2018-06-25 DIAGNOSIS — Z79899 Other long term (current) drug therapy: Secondary | ICD-10-CM | POA: Diagnosis not present

## 2018-06-25 DIAGNOSIS — Z87891 Personal history of nicotine dependence: Secondary | ICD-10-CM | POA: Insufficient documentation

## 2018-06-25 DIAGNOSIS — S99912A Unspecified injury of left ankle, initial encounter: Secondary | ICD-10-CM | POA: Diagnosis present

## 2018-06-25 DIAGNOSIS — Y939 Activity, unspecified: Secondary | ICD-10-CM | POA: Diagnosis not present

## 2018-06-25 NOTE — Discharge Instructions (Signed)
You may alternate taking Tylenol and Ibuprofen as needed for pain control. You may take 400-600 mg of ibuprofen every 6 hours and [id number] mg of Tylenol every 6 hours. Do not exceed 4000 mg of Tylenol daily as this can lead to liver damage. Also, make sure to take Ibuprofen with meals as it can cause an upset stomach. Do not take other NSAIDs while taking Ibuprofen such as (Aleve, Naprosyn, Aspirin, Celebrex, etc) and do not take more than the prescribed dose as this can lead to ulcers and bleeding in your GI tract. You may use warm and cold compresses to help with your symptoms.   Please follow up with your primary doctor or your orthopedic doctor within the next 7-10 days for re-evaluation and further treatment of your symptoms.   Please return to the ER sooner if you have any new or worsening symptoms.
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
## 2018-06-25 NOTE — ED Notes (Signed)
Pt is alert and oriented x 4 and is verbally responsive. Pt reports falling yesterday, and awoke this am with RT knee swelling and left ankle pain 8/10, and swelling.

## 2018-06-25 NOTE — ED Notes (Signed)
Bed: WTR8 Expected date:  Expected time:  Means of arrival:  Comments: 

## 2018-06-25 NOTE — ED Triage Notes (Signed)
Pt complains of left ankle pain and swelling since twisting her ankle when getting out of her car yesterday.

## 2018-06-30 LAB — LIPID PANEL
Cholesterol: 226 — AB (ref 0–200)
HDL: 76 — AB (ref 35–70)
LDL Cholesterol: 133
Triglycerides: 71 (ref 40–160)

## 2018-06-30 LAB — VITAMIN D 25 HYDROXY (VIT D DEFICIENCY, FRACTURES): Vit D, 25-Hydroxy: 17

## 2018-06-30 LAB — VITAMIN B12: Vitamin B-12: 570

## 2018-07-04 ENCOUNTER — Other Ambulatory Visit: Payer: Self-pay | Admitting: Internal Medicine

## 2018-07-04 DIAGNOSIS — Z1231 Encounter for screening mammogram for malignant neoplasm of breast: Secondary | ICD-10-CM

## 2018-07-11 ENCOUNTER — Ambulatory Visit
Admission: RE | Admit: 2018-07-11 | Discharge: 2018-07-11 | Disposition: A | Discharge status: Home / Self Care | Payer: Medicaid Other | Source: Ambulatory Visit | Attending: Internal Medicine | Admitting: Internal Medicine

## 2018-07-11 DIAGNOSIS — Z1231 Encounter for screening mammogram for malignant neoplasm of breast: Secondary | ICD-10-CM

## 2018-09-13 ENCOUNTER — Ambulatory Visit: Payer: Medicaid Other | Admitting: Internal Medicine

## 2018-09-20 ENCOUNTER — Telehealth: Payer: Self-pay | Admitting: Internal Medicine

## 2018-09-20 NOTE — Telephone Encounter (Signed)
Attempted to call pt but unable to reach her. Left message for pt to return call. 

## 2018-09-21 ENCOUNTER — Telehealth: Payer: Self-pay | Admitting: Internal Medicine

## 2018-09-21 NOTE — Telephone Encounter (Signed)
LMTCB will need ov  Please offer appt when she calls back

## 2018-09-21 NOTE — Telephone Encounter (Signed)
Patient returned phone call following up on this message;  pt contact # [phone]

## 2018-09-21 NOTE — Telephone Encounter (Signed)
This is a duplicate msg!! LMTCB and will work off original msg per protocol

## 2018-09-27 NOTE — Telephone Encounter (Signed)
LMTCB

## 2018-09-29 NOTE — Telephone Encounter (Signed)
Attempted to call patient today regarding in need of appt. I did not receive an answer at time of call. I have left a voicemail message for pt to return call. X3  Per triage protocol,  This message will be closed and encounter signed today due to we have tried to call patient three time, no response. When patient returns call, a new message will be open to address patient's concerns at that time of call. Nothing further needed at this time of call.

## 2018-10-08 ENCOUNTER — Other Ambulatory Visit: Payer: Self-pay | Admitting: Internal Medicine

## 2018-10-12 ENCOUNTER — Telehealth: Payer: Self-pay | Admitting: Internal Medicine

## 2018-10-12 NOTE — Telephone Encounter (Signed)
LMTCB

## 2018-10-13 ENCOUNTER — Telehealth: Payer: Self-pay

## 2018-10-13 NOTE — Telephone Encounter (Signed)
Medication name and strength: Dymista 137/50 Provider: Dr. Christinia Gully Pharmacy: [facility] [place] [address] Patient insurance ID: [id number] O Phone: [phone] Fax: [phone]  Was the PA started on CMM?  No, Started over the phone and spoke with Clifton James from [employer].  PA has been approved from 10/13/2018-10/08/2019 [id number]  Called and spoke with pharmacist. They are aware this was approved. Sending in refill for patient. Nothing further needed.

## 2018-10-13 NOTE — Telephone Encounter (Signed)
LMTCB

## 2018-10-14 NOTE — Telephone Encounter (Signed)
Attempted to call pt but after two rings, the call went to VM. Left message for pt to return call.

## 2018-10-17 NOTE — Telephone Encounter (Signed)
Left message for patient-stating if she still needs to be treated to please call the office for an appointment.

## 2018-10-18 ENCOUNTER — Ambulatory Visit: Payer: Medicaid Other | Admitting: Primary Care
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
## 2018-10-19 ENCOUNTER — Encounter: Payer: Self-pay | Admitting: Primary Care

## 2018-10-19 ENCOUNTER — Telehealth: Payer: Self-pay | Admitting: Primary Care

## 2018-10-19 ENCOUNTER — Ambulatory Visit (INDEPENDENT_AMBULATORY_CARE_PROVIDER_SITE_OTHER): Payer: Medicaid Other | Admitting: Primary Care

## 2018-10-19 DIAGNOSIS — J310 Chronic rhinitis: Secondary | ICD-10-CM | POA: Diagnosis not present

## 2018-10-19 NOTE — Telephone Encounter (Signed)
Please let patient know Chlor-a-tab can cause some sleepiness, do not dive while taking.

## 2018-10-19 NOTE — Patient Instructions (Signed)
Symptoms consistent with allergic rhinitis versus chronic sinusitis   Recommends: Chlor-a-tab 4mg  every 4-6 hours as needed for rhinitis/nasal drainage     Mucinex twice daily for head congestion  Follow-up: If not better in 10 days please call or return, may consider abx course     Allergic Rhinitis, Adult Allergic rhinitis is a reaction to allergens in the air. Allergens are tiny specks (particles) in the air that cause your body to have an allergic reaction. This condition cannot be passed from person to person (is not contagious). Allergic rhinitis cannot be cured, but it can be controlled. There are two types of allergic rhinitis:  Seasonal. This type is also called hay fever. It happens only during certain times of the year.  Perennial. This type can happen at any time of the year. What are the causes? This condition may be caused by:  Pollen from grasses, trees, and weeds.  House dust mites.  Pet dander.  Mold. What are the signs or symptoms? Symptoms of this condition include:  Sneezing.  Runny or stuffy nose (nasal congestion).  A lot of mucus in the back of the throat (postnasal drip).  Itchy nose.  Tearing of the eyes.  Trouble sleeping.  Being sleepy during day. How is this treated? There is no cure for this condition. You should avoid things that trigger your symptoms (allergens). Treatment can help to relieve symptoms. This may include:  Medicines that block allergy symptoms, such as antihistamines. These may be given as a shot, nasal spray, or pill.  Shots that are given until your body becomes less sensitive to the allergen (desensitization).  Stronger medicines, if all other treatments have not worked. Follow these instructions at home: Avoiding allergens   Find out what you are allergic to. Common allergens include smoke, dust, and pollen.  Avoid them if you can. These are some of the things that you can do to avoid allergens: ? Replace  carpet with wood, tile, or vinyl flooring. Carpet can trap dander and dust. ? Clean any mold found in the home. ? Do not smoke. Do not allow smoking in your home. ? Change your heating and air conditioning filter at least once a month. ? During allergy season:  Keep windows closed as much as you can. If possible, use air conditioning when there is a lot of pollen in the air.  Use a special filter for allergies with your furnace and air conditioner.  Plan outdoor activities when pollen counts are lowest. This is usually during the early morning or evening hours.  If you do go outdoors when pollen count is high, wear a special mask for people with allergies.  When you come indoors, take a shower and change your clothes before sitting on furniture or bedding. General instructions  Do not use fans in your home.  Do not hang clothes outside to dry.  Wear sunglasses to keep pollen out of your eyes.  Wash your hands right away after you touch household pets.  Take over-the-counter and prescription medicines only as told by your doctor.  Keep all follow-up visits as told by your doctor. This is important. Contact a doctor if:  You have a fever.  You have a cough that does not go away (is persistent).  You start to make whistling sounds when you breathe (wheeze).  Your symptoms do not get better with treatment.  You have thick fluid coming from your nose.  You start to have nosebleeds. Get help right away  if:  Your tongue or your lips are swollen.  You have trouble breathing.  You feel dizzy or you feel like you are going to pass out (faint).  You have cold sweats. Summary  Allergic rhinitis is a reaction to allergens in the air.  This condition may be caused by allergens. These include pollen, dust mites, pet dander, and mold.  Symptoms include a runny, itchy nose, sneezing, or tearing eyes. You may also have trouble sleeping or feel sleepy during the day.  Treatment  includes taking medicines and avoiding allergens. You may also get shots or take stronger medicines.  Get help if you have a fever or a cough that does not stop. Get help right away if you are short of breath. This information is not intended to replace advice given to you by your health care provider. Make sure you discuss any questions you have with your health care provider. Document Released: 01/21/2011 Document Revised: 04/12/2018 Document Reviewed: 04/12/2018 Elsevier Interactive Patient Education  2019 [employer].

## 2018-10-19 NOTE — Telephone Encounter (Signed)
Already did that when I was discharging her.

## 2018-10-20 ENCOUNTER — Encounter: Payer: Self-pay | Admitting: Primary Care

## 2018-10-20 NOTE — Progress Notes (Signed)
Chart and office note reviewed in detail  > agree with a/p as outlined    

## 2018-10-20 NOTE — Assessment & Plan Note (Signed)
-   Symptoms consistent with allergic rhinitis versus chronic sinusitis  - Continue Dymista nasal spray - Recommend Chlor-a-tab 4mg  every 4-6 hours as needed and Mucinex twice daily for congestion - Return or call if not better in 10 days

## 2018-11-23 ENCOUNTER — Encounter: Payer: Self-pay | Admitting: Primary Care

## 2018-11-23 ENCOUNTER — Ambulatory Visit (INDEPENDENT_AMBULATORY_CARE_PROVIDER_SITE_OTHER): Payer: Medicaid Other | Admitting: Primary Care

## 2018-11-23 VITALS — BP 124/78 | HR 97 | Ht 65.0 in | Wt 172.2 lb

## 2018-11-23 DIAGNOSIS — J0181 Other acute recurrent sinusitis: Secondary | ICD-10-CM | POA: Diagnosis not present

## 2018-11-23 DIAGNOSIS — J45909 Unspecified asthma, uncomplicated: Secondary | ICD-10-CM | POA: Diagnosis not present

## 2018-11-23 DIAGNOSIS — J310 Chronic rhinitis: Secondary | ICD-10-CM | POA: Diagnosis not present

## 2018-11-23 LAB — CBC WITH DIFFERENTIAL/PLATELET
BASOS PCT: 0.4 % (ref 0.0–3.0)
Basophils Absolute: 0 10*3/uL (ref 0.0–0.1)
EOS PCT: 2.5 % (ref 0.0–5.0)
Eosinophils Absolute: 0.2 10*3/uL (ref 0.0–0.7)
HEMATOCRIT: 36.5 % (ref 36.0–46.0)
Hemoglobin: 11.9 g/dL — ABNORMAL LOW (ref 12.0–15.0)
LYMPHS ABS: 2.5 10*3/uL (ref 0.7–4.0)
LYMPHS PCT: 37.4 % (ref 12.0–46.0)
MCHC: 32.7 g/dL (ref 30.0–36.0)
MCV: 84.2 fl (ref 78.0–100.0)
MONOS PCT: 13.9 % — AB (ref 3.0–12.0)
Monocytes Absolute: 0.9 10*3/uL (ref 0.1–1.0)
NEUTROS ABS: 3 10*3/uL (ref 1.4–7.7)
NEUTROS PCT: 45.8 % (ref 43.0–77.0)
PLATELETS: 264 10*3/uL (ref 150.0–400.0)
RBC: 4.33 Mil/uL (ref 3.87–5.11)
RDW: 15.4 % (ref 11.5–15.5)
WBC: 6.7 10*3/uL (ref 4.0–10.5)

## 2018-11-23 LAB — NITRIC OXIDE: FeNO level (ppb): 33

## 2018-11-23 MED ORDER — AZELASTINE-FLUTICASONE 137-50 MCG/ACT NA SUSP: NASAL | 11 refills | Status: DC

## 2018-11-23 MED ORDER — MOMETASONE FUROATE 110 MCG/INH IN AEPB: 2.0000 | INHALATION_SPRAY | Freq: Two times a day (BID) | RESPIRATORY_TRACT | 0 refills | Status: DC

## 2018-11-23 MED ORDER — AMOXICILLIN-POT CLAVULANATE 875-125 MG PO TABS: 1.0000 | ORAL_TABLET | Freq: Two times a day (BID) | ORAL | 0 refills | Status: DC

## 2018-11-23 MED ORDER — PREDNISONE 10 MG PO TABS: ORAL_TABLET | ORAL | 0 refills | Status: DC
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
## 2018-11-23 NOTE — Patient Instructions (Addendum)
  Orders: - Labs today   Referal: - ENT QX:AFHSVEX sinusitis   Rx: - Augmentin 1 tab twice daily x 7 days - Prednsone 20mg  x 5 days - Trial Asmanex - take 1-2 puffs twice daily (sample given) - Dymista (refilled)  Recommendations: - Continue xyzal allergy   FU: 2-4 weeks with PFTs

## 2018-11-24 LAB — IGE: IGE (IMMUNOGLOBULIN E), SERUM: 5 kU/L (ref <=114)

## 2018-11-25 ENCOUNTER — Encounter: Payer: Self-pay | Admitting: Primary Care

## 2018-11-25 NOTE — Assessment & Plan Note (Addendum)
-   Mild exacerbation with reports of sob - FENO 33 - Prednisone 20mg  x 5 days - Trial Asmanex - take 1-2 puffs twice daily (sample given) - Needs PFTs

## 2018-11-25 NOTE — Assessment & Plan Note (Addendum)
-   Acute recurrent sinusitis  - Augmentin 1 tab twice daily x 7 days - Dymista nasal spray as prescribed (refilled) - Refer to ENT

## 2018-11-28 NOTE — Progress Notes (Signed)
Chart and office note reviewed in detail  > agree with a/p as outlined    

## 2019-02-28 ENCOUNTER — Ambulatory Visit (HOSPITAL_COMMUNITY)
Admission: EM | Admit: 2019-02-28 | Discharge: 2019-02-28 | Disposition: A | Discharge status: Home / Self Care | Payer: Medicaid Other | Attending: Internal Medicine | Admitting: Internal Medicine

## 2019-02-28 ENCOUNTER — Encounter (HOSPITAL_COMMUNITY): Payer: Self-pay | Admitting: Emergency Medicine

## 2019-02-28 ENCOUNTER — Ambulatory Visit (INDEPENDENT_AMBULATORY_CARE_PROVIDER_SITE_OTHER): Payer: Medicaid Other

## 2019-02-28 DIAGNOSIS — S99922A Unspecified injury of left foot, initial encounter: Secondary | ICD-10-CM

## 2019-02-28 DIAGNOSIS — W2209XA Striking against other stationary object, initial encounter: Secondary | ICD-10-CM | POA: Diagnosis not present

## 2019-02-28 DIAGNOSIS — S92505A Nondisplaced unspecified fracture of left lesser toe(s), initial encounter for closed fracture: Secondary | ICD-10-CM | POA: Diagnosis not present
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
## 2019-02-28 NOTE — ED Triage Notes (Signed)
Pt states she kicked her L second toe against a basket two weeks ago and is still causing her pain.

## 2019-03-23 ENCOUNTER — Encounter: Payer: Self-pay | Admitting: Gastroenterology

## 2019-04-06 ENCOUNTER — Other Ambulatory Visit: Payer: Self-pay

## 2019-04-06 ENCOUNTER — Ambulatory Visit: Payer: Medicaid Other | Admitting: *Deleted

## 2019-04-06 VITALS — Ht 66.0 in | Wt 161.0 lb

## 2019-04-06 DIAGNOSIS — Z1211 Encounter for screening for malignant neoplasm of colon: Secondary | ICD-10-CM

## 2019-04-06 MED ORDER — SUPREP BOWEL PREP KIT 17.5-3.13-1.6 GM/177ML PO SOLN: 1.0000 | Freq: Once | ORAL | 0 refills | Status: AC

## 2019-04-06 NOTE — Progress Notes (Signed)
No egg or soy allergy known to patient  No issues with past sedation with any surgeries  or procedures, no intubation problems  Pt takes diet pills per patient- on Phentermine daily  No home 02 use per patient  No blood thinners per patient  Pt denies issues with constipation - she takes Amitiza and it works - stools regular - she also uses Fiber and Fiber teas to help with constipation  No A fib or A flutter  EMMI video sent to pt's e mail   Pt verified name, DOB, address and insurance during PV today. Pt mailed instruction packet to included paper to complete and mail back to [facility] with addressed and stamped envelope, Emmi video, copy of consent form to read and not return, and instructions. Suprep $15  coupon mailed in packet. PV completed over the phone. Pt encouraged to call with questions or issues   Pt is aware that care partner will wait in the car during proceudre; if they feel like they will be too hot to wait in the car; they may wait in the lobby.  We want them to wear a mask (we do not have any that we can provide them), practice social distancing, and we will check their temperatures when they get here.  I did remind patient that their care partner needs to stay in the parking lot the entire time. Pt will wear mask into building.

## 2019-04-14 ENCOUNTER — Telehealth: Payer: Self-pay | Admitting: Gastroenterology

## 2019-04-14 NOTE — Telephone Encounter (Signed)
Discussed her diet. Reviewed her instructions.

## 2019-04-19 ENCOUNTER — Encounter: Payer: Self-pay | Admitting: Gastroenterology

## 2019-04-19 ENCOUNTER — Telehealth: Payer: Self-pay | Admitting: Gastroenterology

## 2019-04-19 NOTE — Telephone Encounter (Signed)

## 2019-04-20 ENCOUNTER — Encounter: Payer: Self-pay | Admitting: Gastroenterology

## 2019-04-20 ENCOUNTER — Ambulatory Visit (AMBULATORY_SURGERY_CENTER): Payer: Medicaid Other | Admitting: Gastroenterology

## 2019-04-20 ENCOUNTER — Other Ambulatory Visit: Payer: Self-pay

## 2019-04-20 VITALS — BP 86/65 | HR 73 | Temp 98.5°F | Resp 15 | Ht 66.0 in | Wt 161.0 lb

## 2019-04-20 DIAGNOSIS — Z1211 Encounter for screening for malignant neoplasm of colon: Secondary | ICD-10-CM

## 2019-04-20 MED ORDER — SODIUM CHLORIDE 0.9 % IV SOLN: 500.0000 mL | Freq: Once | INTRAVENOUS | Status: DC

## 2019-04-20 NOTE — Op Note (Signed)
[facility] Patient Name: Christine Cunningham Procedure Date: 04/20/2019 8:59 AM MRN: [id number] Endoscopist: Mauri Pole , MD Age: 48 Referring MD:  Date of Birth: 1970/12/01 Gender: Female Account #: [id number] Procedure:                Colonoscopy Indications:              Screening for colorectal malignant neoplasm, This                            is the patient's first colonoscopy Medicines:                Monitored Anesthesia Care Procedure:                Pre-Anesthesia Assessment:                           - Prior to the procedure, a History and Physical                            was performed, and patient medications and                            allergies were reviewed. The patient's tolerance of                            previous anesthesia was also reviewed. The risks                            and benefits of the procedure and the sedation                            options and risks were discussed with the patient.                            All questions were answered, and informed consent                            was obtained. Prior Anticoagulants: The patient has                            taken no previous anticoagulant or antiplatelet                            agents. ASA Grade Assessment: II - A patient with                            mild systemic disease. After reviewing the risks                            and benefits, the patient was deemed in                            satisfactory condition to undergo the procedure.  After obtaining informed consent, the colonoscope                            was passed under direct vision. Throughout the                            procedure, the patient's blood pressure, pulse, and                            oxygen saturations were monitored continuously. The                            Colonoscope was introduced through the anus and                            advanced to the the  terminal ileum, with                            identification of the appendiceal orifice and IC                            valve. The colonoscopy was performed without                            difficulty. The patient tolerated the procedure                            well. The quality of the bowel preparation was                            excellent. The terminal ileum, ileocecal valve,                            appendiceal orifice, and rectum were photographed. Scope In: 9:00:55 AM Scope Out: 9:11:00 AM Scope Withdrawal Time: 0 hours 6 minutes 42 seconds  Total Procedure Duration: 0 hours 10 minutes 5 seconds  Findings:                 The perianal and digital rectal examinations were                            normal.                           Non-bleeding internal hemorrhoids were found during                            retroflexion. The hemorrhoids were small.                           The exam was otherwise without abnormality. Complications:            No immediate complications. Estimated Blood Loss:     Estimated blood loss: none. Impression:               - Non-bleeding internal hemorrhoids.                           -  The examination was otherwise normal.                           - No specimens collected. Recommendation:           - Patient has a contact number available for                            emergencies. The signs and symptoms of potential                            delayed complications were discussed with the                            patient. Return to normal activities tomorrow.                            Written discharge instructions were provided to the                            patient.                           - Resume previous diet.                           - Continue present medications.                           - Repeat colonoscopy in 10 years for screening                            purposes. Mauri Pole, MD 04/20/2019 9:15:05 AM This  report has been signed electronically.

## 2019-04-20 NOTE — Progress Notes (Signed)
Temperature taken by [facility], CMA, VS taken by Rica Mote, CMA

## 2019-04-20 NOTE — Patient Instructions (Signed)
Information on hemorrhoids given to you today.  YOU HAD AN ENDOSCOPIC PROCEDURE TODAY AT [facility]:   Refer to the procedure report that was given to you for any specific questions about what was found during the examination.  If the procedure report does not answer your questions, please call your gastroenterologist to clarify.  If you requested that your care partner not be given the details of your procedure findings, then the procedure report has been included in a sealed envelope for you to review at your convenience later.  YOU SHOULD EXPECT: Some feelings of bloating in the abdomen. Passage of more gas than usual.  Walking can help get rid of the air that was put into your GI tract during the procedure and reduce the bloating. If you had a lower endoscopy (such as a colonoscopy or flexible sigmoidoscopy) you may notice spotting of blood in your stool or on the toilet paper. If you underwent a bowel prep for your procedure, you may not have a normal bowel movement for a few days.  Please Note:  You might notice some irritation and congestion in your nose or some drainage.  This is from the oxygen used during your procedure.  There is no need for concern and it should clear up in a day or so.  SYMPTOMS TO REPORT IMMEDIATELY:   Following lower endoscopy (colonoscopy or flexible sigmoidoscopy):  Excessive amounts of blood in the stool  Significant tenderness or worsening of abdominal pains  Swelling of the abdomen that is new, acute  Fever of 100F or higher   For urgent or emergent issues, a gastroenterologist can be reached at any hour by calling [phone].   DIET:  We do recommend a small meal at first, but then you may proceed to your regular diet.  Drink plenty of fluids but you should avoid alcoholic beverages for 24 hours.  ACTIVITY:  You should plan to take it easy for the rest of today and you should NOT DRIVE or use heavy machinery until tomorrow (because  of the sedation medicines used during the test).    FOLLOW UP: Our staff will call the number listed on your records 48-72 hours following your procedure to check on you and address any questions or concerns that you may have regarding the information given to you following your procedure. If we do not reach you, we will leave a message.  We will attempt to reach you two times.  During this call, we will ask if you have developed any symptoms of COVID 19. If you develop any symptoms (ie: fever, flu-like symptoms, shortness of breath, cough etc.) before then, please call [phone].  If you test positive for Covid 19 in the 2 weeks post procedure, please call and report this information to [place].    If any biopsies were taken you will be contacted by phone or by letter within the next 1-3 weeks.  Please call us at [phone] if you have not heard about the biopsies in 3 weeks.    SIGNATURES/CONFIDENTIALITY: You and/or your care partner have signed paperwork which will be entered into your electronic medical record.  These signatures attest to the fact that that the information above on your After Visit Summary has been reviewed and is understood.  Full responsibility of the confidentiality of this discharge information lies with you and/or your care-partner.

## 2019-04-20 NOTE — Progress Notes (Signed)
A/ox3, pleased with MAC, report to RN 

## 2019-04-24 ENCOUNTER — Telehealth: Payer: Self-pay | Admitting: *Deleted

## 2019-04-24 NOTE — Telephone Encounter (Signed)
1. Have you developed a fever since your procedure? no  2.   Have you had an respiratory symptoms (SOB or cough) since your procedure? no  3.   Have you tested positive for COVID 19 since your procedure no  4.   Have you had any family members/close contacts diagnosed with the COVID 19 since your procedure?  no   If yes to any of these questions please route to Joylene John, RN and Alphonsa Gin, Therapist, sports.  Follow up Call-  Call back number 04/20/2019  Post procedure Call Back phone  # [phone]  Permission to leave phone message Yes  Some recent data might be hidden     Patient questions:  Do you have a fever, pain , or abdominal swelling? No. Pain Score  0 *  Have you tolerated food without any problems? Yes.    Have you been able to return to your normal activities? Yes.    Do you have any questions about your discharge instructions: Diet   No. Medications  No. Follow up visit  No.  Do you have questions or concerns about your Care? No.  Actions: * If pain score is 4 or above: No action needed, pain <4.

## 2019-05-19 ENCOUNTER — Telehealth: Payer: Self-pay | Admitting: Internal Medicine

## 2019-05-19 DIAGNOSIS — J0181 Other acute recurrent sinusitis: Secondary | ICD-10-CM

## 2019-05-19 MED ORDER — DOXYCYCLINE HYCLATE 100 MG PO TABS: 100.0000 mg | ORAL_TABLET | Freq: Two times a day (BID) | ORAL | 0 refills | Status: AC

## 2019-05-19 NOTE — Telephone Encounter (Signed)
Patient was last seen in February/2020 by EW.  Looks like there was supposed to be a referral placed for ENT.  Please check with the patient and see if she was ever seen by ENT.  Per chart review does not look like that.  If so I would like for the patient to get established with a ENT in [place].  I would prefer Dr. Wilburn Cornelia or Dr. Redmond Baseman.  Can go ahead and offer patient:  Doxycycline >>> 1 100 mg tablet every 12 hours for 10 days >>>take with food  >>>wear sunscreen   Please place order.  Please also place order for referral for ENT.  It looks like patient was also supposed to complete pulmonary function testing.  This is not been done yet.  Please route a message to Sharl Ma to get the patient scheduled for pulmonary function test and return to office visit with EW or MR if there is no availability then another APP.  Wyn Quaker, FNP

## 2019-05-19 NOTE — Telephone Encounter (Signed)
Attempted to call pt but unable to reach. Left message for pt to return call. 

## 2019-05-19 NOTE — Telephone Encounter (Signed)
Call returned to patient, she reports she does see Dr. Redmond Baseman. I advised her to call their office next week to make a follow up appt. She was okay with the antibiotic. Pharmacy confirmed. Medication sent in. Voiced understanding. Nothing further is needed at this time.

## 2019-05-19 NOTE — Telephone Encounter (Signed)
Pt returning phone call she said you can try her mothers number also if you need to [phone]

## 2019-05-19 NOTE — Telephone Encounter (Signed)
Call returned to patient, she reports she is having increased nasal drainage and a sinus headache. She reports this has been going on for weeks. She reports she has been using her nasal sprays and nothing is helping. She reports using a humidifier to help break up the mucous but it only makes her nose run more. She reports increased congestion and cough with clear mucous. Reports MR usually gives her an antibiotic when she has tried treating at home and it does not work. Confirmed pharmacy on file.   NKDA  B Mack please advise. Thanks.

## 2019-06-27 ENCOUNTER — Telehealth: Payer: Self-pay | Admitting: Primary Care

## 2019-06-27 NOTE — Telephone Encounter (Signed)
Call returned to patient, she state while washing her car this weekend she thinks she stepped into a pile of ants, she reports she noticed there was a lot of ants on her sneakers but she has developed red bumps  on both legs. She reports she has been using cortisone cream and she denies drainage. She reports she is allergic to grass and pollen she just wants to be sure she is okay. Denies SOB or chest pain. Denies any respiratory symptoms. She reports she can wait until tomorrow , it is not urgent. She is requesting recommendations from NP [facility]. Denies swelling.   Beth please advise. Thanks.

## 2019-06-28 NOTE — Telephone Encounter (Signed)
Patient is calling office back from phone call yesterday.  I let patient know Beth's recommendations.   Patient voiced understanding.  Nothing further needed at this time.  Patient did want a flu shot scheduled so I will route this to injection to get patient on their schedule.  Patient also seeing Dr. Redmond Baseman on Monday for her sinuses.

## 2019-06-28 NOTE — Telephone Encounter (Signed)
I would use over the counter cortisone cream. Make sure she is taking a daily antihistamine and she can take benadryl as needed (as long as she will not be driving because it can cause sleepiness). Ice to area can also help inflammation and itch.

## 2019-06-29 NOTE — Telephone Encounter (Signed)
Called and spoke with Patient.  Patient scheduled 06/30/19, at 1030 for regular flu vaccine.  Nothing further at this time.

## 2019-06-30 ENCOUNTER — Telehealth: Payer: Self-pay | Admitting: Internal Medicine

## 2019-06-30 ENCOUNTER — Ambulatory Visit: Payer: Medicaid Other

## 2019-06-30 NOTE — Telephone Encounter (Signed)
Called and spoke with Patient. Patient scheduled for regular flu vaccine at 0930, 07/03/19.  Nothing further at time.

## 2019-07-03 ENCOUNTER — Other Ambulatory Visit: Payer: Self-pay

## 2019-07-03 ENCOUNTER — Ambulatory Visit (INDEPENDENT_AMBULATORY_CARE_PROVIDER_SITE_OTHER): Payer: Medicaid Other

## 2019-07-03 DIAGNOSIS — Z23 Encounter for immunization: Secondary | ICD-10-CM | POA: Diagnosis not present

## 2019-08-18 ENCOUNTER — Other Ambulatory Visit: Payer: Self-pay

## 2019-08-18 DIAGNOSIS — Z20822 Contact with and (suspected) exposure to covid-19: Secondary | ICD-10-CM

## 2019-08-21 LAB — NOVEL CORONAVIRUS, NAA: SARS-CoV-2, NAA: NOT DETECTED

## 2019-08-22 ENCOUNTER — Telehealth: Payer: Self-pay | Admitting: *Deleted

## 2019-08-22 NOTE — Telephone Encounter (Signed)
Patient returned call from the auto system ,and says she viewed results on my chart .

## 2020-01-04 ENCOUNTER — Ambulatory Visit: Payer: Medicaid Other | Attending: Internal Medicine

## 2020-01-04 DIAGNOSIS — Z23 Encounter for immunization: Secondary | ICD-10-CM

## 2020-01-04 NOTE — Progress Notes (Signed)
   Covid-19 Vaccination Clinic  Name:  GURNAAZ SCHWAKE    MRN: [id number] DOB: [birth year]  01/04/2020  Ms. Negro was observed post Covid-19 immunization for 15 minutes without incident. She was provided with Vaccine Information Sheet and instruction to access the V-Safe system.   Ms. Ciaburri was instructed to call 911 with any severe reactions post vaccine: Marland Kitchen Difficulty breathing  . Swelling of face and throat  . A fast heartbeat  . A bad rash all over body  . Dizziness and weakness   Immunizations Administered    Name Date Dose VIS Date Route   Pfizer COVID-19 Vaccine 01/04/2020 10:31 AM 0.3 mL 09/15/2019 Intramuscular   Manufacturer: [employer], [employer]   Lot: [id number]   [place]: [id number]

## 2020-01-29 ENCOUNTER — Ambulatory Visit: Payer: Medicaid Other | Attending: Internal Medicine

## 2020-01-29 DIAGNOSIS — Z23 Encounter for immunization: Secondary | ICD-10-CM

## 2020-01-29 NOTE — Progress Notes (Signed)
   Covid-19 Vaccination Clinic  Name:  Christine Cunningham    MRN: [id number] DOB: January 23, 1971  01/29/2020  [name] was observed post Covid-19 immunization for 15 minutes without incident. She was provided with Vaccine Information Sheet and instruction to access the V-Safe system.   [name] was instructed to call 911 with any severe reactions post vaccine: Marland Kitchen Difficulty breathing  . Swelling of face and throat  . A fast heartbeat  . A bad rash all over body  . Dizziness and weakness   Immunizations Administered    Name Date Dose VIS Date Route   Pfizer COVID-19 Vaccine 01/29/2020 10:40 AM 0.3 mL 11/29/2018 Intramuscular   Manufacturer: [place]   Lot: [id number]   [place]: [id number]

## 2020-03-28 ENCOUNTER — Ambulatory Visit: Payer: Medicaid Other | Admitting: Obstetrics & Gynecology

## 2020-04-22 ENCOUNTER — Telehealth: Payer: Self-pay | Admitting: Internal Medicine

## 2020-04-22 NOTE — Telephone Encounter (Signed)
Pt has not been seen at office since 11/23/18. Pt will need an appt prior to [place] sending meds to pharmacy for her.  Called pt who stated she has had sinus drainage x2 weeks but since she has not had any relief with it, she decided to call. Pt stated the drainage has been making her nauseous. Stated to her that we would need to schedule a visit since she has not been seen in 1 year and she verbalized understanding. Pt has been scheduled for an appt 7/21 with Beth. Nothing further needed.

## 2020-04-24 ENCOUNTER — Other Ambulatory Visit: Payer: Self-pay

## 2020-04-24 ENCOUNTER — Ambulatory Visit (INDEPENDENT_AMBULATORY_CARE_PROVIDER_SITE_OTHER): Payer: Medicaid Other | Admitting: Primary Care

## 2020-04-24 ENCOUNTER — Encounter: Payer: Self-pay | Admitting: Primary Care

## 2020-04-24 DIAGNOSIS — J0181 Other acute recurrent sinusitis: Secondary | ICD-10-CM

## 2020-04-24 MED ORDER — DOXYCYCLINE HYCLATE 100 MG PO TABS: 100.0000 mg | ORAL_TABLET | Freq: Two times a day (BID) | ORAL | 0 refills | Status: DC

## 2020-04-24 MED ORDER — AZELASTINE-FLUTICASONE 137-50 MCG/ACT NA SUSP: NASAL | 2 refills | Status: DC

## 2020-04-24 NOTE — Patient Instructions (Signed)
°  Continue Asmanex 1-2 puffs twice daily Continue Xyzal daily Use netti pot twice daily as needed for congestion  Sending in doxycycline and refilling dymista for sinusitis  Follow up if no improvement or if symptoms worsen
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
## 2020-05-07 ENCOUNTER — Ambulatory Visit: Payer: Medicaid Other | Admitting: Physician Assistant

## 2020-05-07 ENCOUNTER — Encounter: Payer: Self-pay | Admitting: Physician Assistant
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
## 2020-05-10 ENCOUNTER — Encounter: Payer: Self-pay | Admitting: Internal Medicine

## 2020-05-17 ENCOUNTER — Encounter: Payer: Self-pay | Admitting: Internal Medicine

## 2020-07-03 ENCOUNTER — Telehealth: Payer: Self-pay | Admitting: Internal Medicine

## 2020-07-03 NOTE — Telephone Encounter (Signed)
ATC patient.  Left message to call and schedule an appointment for her flu shot.

## 2020-07-04 NOTE — Telephone Encounter (Signed)
ATC Patient to schedule regular flu vaccine.  LM to call back.

## 2020-07-05 NOTE — Telephone Encounter (Signed)
ATC Patient.  LM to call back to schedule flu vaccine.

## 2020-07-05 NOTE — Telephone Encounter (Signed)
Pt returning a phone call about scheduling her flu shot. Pt can be reached at [phone].

## 2020-07-09 ENCOUNTER — Ambulatory Visit: Payer: Medicaid Other | Attending: Internal Medicine

## 2020-07-09 DIAGNOSIS — Z23 Encounter for immunization: Secondary | ICD-10-CM

## 2020-07-09 NOTE — Progress Notes (Signed)
° °  Covid-19 Vaccination Clinic  Name:  KAARI ZEIGLER    MRN: [id number] DOB: October 27, 1970  07/09/2020  Ms. Narine was observed post Covid-19 immunization for 15 minutes without incident. She was provided with Vaccine Information Sheet and instruction to access the V-Safe system.   Ms. Moman was instructed to call 911 with any severe reactions post vaccine:  Difficulty breathing   Swelling of face and throat   A fast heartbeat   A bad rash all over body   Dizziness and weakness

## 2020-07-10 NOTE — Telephone Encounter (Signed)
ATC Patient to schedule flu vaccine x's 2.  LM to call back if she was interested and receiving a flu vaccine here. Per office policy, message closed.

## 2020-07-16 ENCOUNTER — Encounter: Payer: Self-pay | Admitting: Physician Assistant

## 2020-07-16 ENCOUNTER — Ambulatory Visit (INDEPENDENT_AMBULATORY_CARE_PROVIDER_SITE_OTHER): Payer: Medicaid Other | Admitting: Physician Assistant

## 2020-07-16 ENCOUNTER — Other Ambulatory Visit: Payer: Self-pay | Admitting: Physician Assistant

## 2020-07-16 ENCOUNTER — Other Ambulatory Visit: Payer: Self-pay

## 2020-07-16 VITALS — BP 120/80 | HR 84 | Temp 98.1°F | Ht 62.0 in | Wt 170.0 lb

## 2020-07-16 DIAGNOSIS — F411 Generalized anxiety disorder: Secondary | ICD-10-CM

## 2020-07-16 DIAGNOSIS — J329 Chronic sinusitis, unspecified: Secondary | ICD-10-CM

## 2020-07-16 DIAGNOSIS — J4540 Moderate persistent asthma, uncomplicated: Secondary | ICD-10-CM | POA: Diagnosis not present

## 2020-07-16 MED ORDER — LORAZEPAM 0.5 MG PO TABS
0.5000 mg | ORAL_TABLET | Freq: Every day | ORAL | 2 refills | Status: DC
Start: 2020-07-16 — End: 2020-10-15

## 2020-07-16 MED ORDER — LORAZEPAM 1 MG PO TABS
1.0000 mg | ORAL_TABLET | Freq: Every day | ORAL | 2 refills | Status: DC
Start: 2020-07-16 — End: 2020-10-15

## 2020-07-16 MED ORDER — ASMANEX (7 METERED DOSES) 110 MCG/INH IN AEPB: 2.0000 | INHALATION_SPRAY | Freq: Two times a day (BID) | RESPIRATORY_TRACT | 5 refills | Status: DC

## 2020-07-16 MED ORDER — AZELASTINE-FLUTICASONE 137-50 MCG/ACT NA SUSP: 1.0000 | Freq: Every day | NASAL | 3 refills | Status: DC

## 2020-07-16 MED ORDER — PROAIR HFA 108 (90 BASE) MCG/ACT IN AERS: 1.0000 | INHALATION_SPRAY | Freq: Four times a day (QID) | RESPIRATORY_TRACT | 5 refills | Status: DC | PRN

## 2020-07-16 MED ORDER — BECLOMETHASONE DIPROPIONATE 40 MCG/ACT IN AERS: 2.0000 | INHALATION_SPRAY | Freq: Every day | RESPIRATORY_TRACT | 12 refills | Status: DC

## 2020-07-16 NOTE — Patient Instructions (Signed)
It was great to see you!  We are decreasing your ativan from 2 mg to 1.5 mg daily. You will be getting a prescription of 1 mg tablets and a prescription of 0.5 mg tablets. You can take one of each of these daily for a total of 1.5 mg daily.  Let's follow-up in 3 months to discuss how you are doing with this, sooner with concerns.  Take care,  [name] PA-C
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
## 2020-07-18 ENCOUNTER — Telehealth: Payer: Self-pay | Admitting: *Deleted

## 2020-07-18 NOTE — Telephone Encounter (Signed)
PA Rx Azelastine-Fluticasone information has been sent to [employer]. Awaiting for determination

## 2020-07-19 NOTE — Telephone Encounter (Signed)
PA Rx Asmanex  information has been sent to [employer]. Awaiting for determination

## 2020-07-19 NOTE — Telephone Encounter (Signed)
PA for Qvar Redihaler was send today  Awaiting for determination

## 2020-07-19 NOTE — Telephone Encounter (Signed)
PA Rx Azelastine-Fluticasone was denied by insurance

## 2020-07-22 ENCOUNTER — Encounter: Payer: Self-pay | Admitting: Physician Assistant

## 2020-07-22 ENCOUNTER — Telehealth: Payer: Self-pay | Admitting: *Deleted

## 2020-07-22 DIAGNOSIS — Z332 Encounter for elective termination of pregnancy: Secondary | ICD-10-CM | POA: Insufficient documentation

## 2020-07-22 DIAGNOSIS — N852 Hypertrophy of uterus: Secondary | ICD-10-CM | POA: Insufficient documentation

## 2020-07-22 DIAGNOSIS — A749 Chlamydial infection, unspecified: Secondary | ICD-10-CM | POA: Insufficient documentation

## 2020-07-22 MED ORDER — AZELASTINE HCL 0.1 % NA SOLN: 1.0000 | Freq: Two times a day (BID) | NASAL | 12 refills | Status: DC

## 2020-07-22 MED ORDER — FLOVENT DISKUS 50 MCG/BLIST IN AEPB: 1.0000 | INHALATION_SPRAY | Freq: Two times a day (BID) | RESPIRATORY_TRACT | 12 refills | Status: DC

## 2020-07-22 NOTE — Telephone Encounter (Signed)
Spoke to pt told her need to change two of her medications due to insurance denied them. Told her will send in Astelin nasal spray and Flovent Diskus. Pt verbalized understanding and also wanted to know if she can have her Phentermine refilled. Told her will have to send message to [facility] if she can it will be sent to pharmacy. If not you can discuss at next f/u. Pt verbalized understanding.

## 2020-07-22 NOTE — Telephone Encounter (Signed)
Christine Cunningham, pt would like a refill on her Phentermine.

## 2020-07-22 NOTE — Telephone Encounter (Signed)
Called pt back in regards to Phentermine, told her Aldona Bar said we did not discuss this during her visit.  Because she has a history of stroke, I do not feel like it is safe to take this medication, as it can increase risk of stroke or heart attack. I do not feel comfortable prescribing this. Pt verbalized understanding.

## 2020-07-22 NOTE — Telephone Encounter (Signed)
We did not discuss this during her visit.  Because she has a history of stroke, I do not feel like it is safe to take this medication, as it can increase risk of stroke or heart attack. I do not feel comfortable prescribing this.

## 2020-07-30 ENCOUNTER — Other Ambulatory Visit: Payer: Self-pay

## 2020-07-30 ENCOUNTER — Ambulatory Visit (INDEPENDENT_AMBULATORY_CARE_PROVIDER_SITE_OTHER): Payer: Medicaid Other

## 2020-07-30 DIAGNOSIS — Z23 Encounter for immunization: Secondary | ICD-10-CM | POA: Diagnosis not present

## 2020-08-12 ENCOUNTER — Telehealth: Payer: Self-pay

## 2020-08-12 NOTE — Telephone Encounter (Signed)
Patient is requesting medication for her sinuses patient is expierceing sneezing coughing,runny nose, Has tried zyrtec and other sinus medications  And nasal sprays and still cant seem to get rid of the pressure   Patient is also experiencing symptoms for a yeast infection and would like  A medication to help with that as well   I explained to patient she would more than likely need appt for both of these kinds of symptoms but she just wanted to speak to a nurse

## 2020-08-12 NOTE — Telephone Encounter (Signed)
Please see message and advise 

## 2020-08-12 NOTE — Telephone Encounter (Signed)
Patient will need a virtual appointment to discuss (unless she has recent COVID testing and can prove negative result.)

## 2020-08-12 NOTE — Telephone Encounter (Signed)
LVM to call back and schedule virtual unless she has neg covid test

## 2020-08-16 ENCOUNTER — Ambulatory Visit: Payer: Medicaid Other | Admitting: Physician Assistant

## 2020-08-20 ENCOUNTER — Other Ambulatory Visit: Payer: Self-pay

## 2020-08-20 ENCOUNTER — Other Ambulatory Visit: Payer: Self-pay | Admitting: *Deleted

## 2020-08-20 ENCOUNTER — Ambulatory Visit (INDEPENDENT_AMBULATORY_CARE_PROVIDER_SITE_OTHER): Payer: Medicaid Other | Admitting: Physician Assistant

## 2020-08-20 ENCOUNTER — Telehealth: Payer: Self-pay | Admitting: *Deleted

## 2020-08-20 ENCOUNTER — Other Ambulatory Visit: Payer: Self-pay | Admitting: Physician Assistant

## 2020-08-20 ENCOUNTER — Ambulatory Visit (HOSPITAL_COMMUNITY)
Admission: RE | Admit: 2020-08-20 | Discharge: 2020-08-20 | Disposition: A | Discharge status: Home / Self Care | Payer: Medicaid Other | Source: Ambulatory Visit | Attending: Physician Assistant | Admitting: Physician Assistant

## 2020-08-20 ENCOUNTER — Encounter: Payer: Self-pay | Admitting: Physician Assistant

## 2020-08-20 VITALS — BP 110/72 | HR 80 | Temp 98.0°F | Ht 62.0 in | Wt 169.0 lb

## 2020-08-20 DIAGNOSIS — M7989 Other specified soft tissue disorders: Secondary | ICD-10-CM | POA: Diagnosis not present

## 2020-08-20 DIAGNOSIS — M25561 Pain in right knee: Secondary | ICD-10-CM | POA: Diagnosis not present

## 2020-08-20 DIAGNOSIS — Z23 Encounter for immunization: Secondary | ICD-10-CM

## 2020-08-20 MED ORDER — DICLOFENAC SODIUM 75 MG PO TBEC: 75.0000 mg | DELAYED_RELEASE_TABLET | Freq: Two times a day (BID) | ORAL | 0 refills | Status: DC

## 2020-08-20 MED ORDER — FUROSEMIDE 20 MG PO TABS: 20.0000 mg | ORAL_TABLET | Freq: Every day | ORAL | 3 refills | Status: DC | PRN

## 2020-08-20 MED ORDER — MELOXICAM 15 MG PO TABS: 15.0000 mg | ORAL_TABLET | Freq: Every day | ORAL | 0 refills | Status: DC
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
## 2020-08-20 NOTE — Patient Instructions (Signed)
It was great to see you!  Start oral diclofenac anti-inflammatory for your knee. This will replace ibuprofen.  If you do not have relief of knee pain after two weeks, please call me and let me know. We will likely order xray at that time.  We are getting an ultrasound to check for a blood clot. I will call you with these results.  Take care,  [name] PA-C

## 2020-08-20 NOTE — Telephone Encounter (Signed)
Please see message. °

## 2020-08-20 NOTE — Telephone Encounter (Signed)
Manuela Schwartz calling from [facility] pt is Negative for RL extremity DVT. Told her okay, thank you can let pt know Neg and let her go. Manuela Schwartz verbalized understanding.

## 2020-09-23 ENCOUNTER — Other Ambulatory Visit: Payer: Self-pay | Admitting: Physician Assistant

## 2020-10-07 ENCOUNTER — Telehealth (INDEPENDENT_AMBULATORY_CARE_PROVIDER_SITE_OTHER): Payer: Medicaid Other | Admitting: Physician Assistant

## 2020-10-07 ENCOUNTER — Encounter: Payer: Self-pay | Admitting: Physician Assistant

## 2020-10-07 ENCOUNTER — Other Ambulatory Visit: Payer: Self-pay

## 2020-10-07 DIAGNOSIS — J329 Chronic sinusitis, unspecified: Secondary | ICD-10-CM | POA: Diagnosis not present

## 2020-10-07 MED ORDER — MONTELUKAST SODIUM 10 MG PO TABS: 10.0000 mg | ORAL_TABLET | Freq: Every day | ORAL | 1 refills | Status: DC

## 2020-10-07 MED ORDER — DOXYCYCLINE HYCLATE 100 MG PO TABS: 100.0000 mg | ORAL_TABLET | Freq: Two times a day (BID) | ORAL | 0 refills | Status: DC
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
## 2020-10-10 ENCOUNTER — Other Ambulatory Visit: Payer: Medicaid Other

## 2020-10-10 DIAGNOSIS — Z20822 Contact with and (suspected) exposure to covid-19: Secondary | ICD-10-CM

## 2020-10-11 LAB — SARS-COV-2, NAA 2 DAY TAT

## 2020-10-11 LAB — NOVEL CORONAVIRUS, NAA: SARS-CoV-2, NAA: NOT DETECTED

## 2020-10-13 ENCOUNTER — Other Ambulatory Visit: Payer: Self-pay | Admitting: Physician Assistant

## 2020-10-14 ENCOUNTER — Encounter: Payer: Self-pay | Admitting: Physician Assistant

## 2020-10-14 ENCOUNTER — Other Ambulatory Visit: Payer: Medicaid Other

## 2020-10-14 DIAGNOSIS — Z20822 Contact with and (suspected) exposure to covid-19: Secondary | ICD-10-CM

## 2020-10-15 NOTE — Telephone Encounter (Signed)
  LAST APPOINTMENT DATE: 10/07/2020  NEXT APPOINTMENT DATE:@1 /07/2021  MEDICATION: LORazepam (ATIVAN) 0.5 MG tablet / LORazepam (ATIVAN) 1 MG tablet / levocetirizine (XYZAL) 5 MG tablet  PHARMACY:[id number] - [place], [place] - [address] AT [address]  Comments: Patient is almost out.    Please advise

## 2020-10-16 LAB — NOVEL CORONAVIRUS, NAA: SARS-CoV-2, NAA: NOT DETECTED

## 2020-10-16 LAB — SARS-COV-2, NAA 2 DAY TAT

## 2020-11-25 ENCOUNTER — Telehealth (INDEPENDENT_AMBULATORY_CARE_PROVIDER_SITE_OTHER): Payer: Medicaid Other | Admitting: Physician Assistant

## 2020-11-25 ENCOUNTER — Encounter: Payer: Self-pay | Admitting: Physician Assistant

## 2020-11-25 VITALS — Ht 62.0 in | Wt 172.0 lb

## 2020-11-25 DIAGNOSIS — F411 Generalized anxiety disorder: Secondary | ICD-10-CM | POA: Diagnosis not present

## 2020-11-25 DIAGNOSIS — F4310 Post-traumatic stress disorder, unspecified: Secondary | ICD-10-CM | POA: Diagnosis not present

## 2020-11-25 MED ORDER — LEVOCETIRIZINE DIHYDROCHLORIDE 5 MG PO TABS: ORAL_TABLET | ORAL | 1 refills | Status: DC

## 2020-11-25 MED ORDER — HYDROXYZINE HCL 25 MG PO TABS: 25.0000 mg | ORAL_TABLET | Freq: Every evening | ORAL | 0 refills | Status: DC | PRN

## 2020-11-25 MED ORDER — SERTRALINE HCL 25 MG PO TABS
25.0000 mg | ORAL_TABLET | Freq: Every day | ORAL | 1 refills | Status: DC
Start: 2020-11-25 — End: 2021-01-14
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
## 2020-12-10 ENCOUNTER — Other Ambulatory Visit: Payer: Self-pay | Admitting: Physician Assistant

## 2021-01-10 ENCOUNTER — Telehealth: Payer: Self-pay

## 2021-01-10 ENCOUNTER — Other Ambulatory Visit: Payer: Self-pay | Admitting: Physician Assistant

## 2021-01-10 MED ORDER — LORAZEPAM 0.5 MG PO TABS: ORAL_TABLET | ORAL | 0 refills | Status: DC

## 2021-01-10 MED ORDER — LORAZEPAM 1 MG PO TABS: ORAL_TABLET | ORAL | 0 refills | Status: DC

## 2021-01-10 NOTE — Telephone Encounter (Signed)
..   LAST APPOINTMENT DATE: 12/10/2020   NEXT APPOINTMENT DATE:@4 /09/2021  MEDICATION:LORazepam (ATIVAN) 1 MG tablet LORazepam (ATIVAN) 0.5 MG tablet     PHARMACY:[id number] - [place], [place] - [address] AT [address]  Okay for refill?  Please advise

## 2021-01-10 NOTE — Telephone Encounter (Signed)
Pt requesting refill for Lorazepam for 0.5 mg & 1 mg tablets. Next OV 4/12

## 2021-01-10 NOTE — Telephone Encounter (Signed)
Sent!

## 2021-01-14 ENCOUNTER — Ambulatory Visit (INDEPENDENT_AMBULATORY_CARE_PROVIDER_SITE_OTHER): Payer: Medicaid Other | Admitting: Physician Assistant

## 2021-01-14 ENCOUNTER — Encounter: Payer: Self-pay | Admitting: Physician Assistant

## 2021-01-14 ENCOUNTER — Other Ambulatory Visit: Payer: Self-pay

## 2021-01-14 VITALS — BP 110/66 | HR 76 | Temp 98.2°F | Ht 62.0 in | Wt 186.0 lb

## 2021-01-14 DIAGNOSIS — F411 Generalized anxiety disorder: Secondary | ICD-10-CM | POA: Diagnosis not present

## 2021-01-14 DIAGNOSIS — E669 Obesity, unspecified: Secondary | ICD-10-CM | POA: Diagnosis not present

## 2021-01-14 LAB — CBC WITH DIFFERENTIAL/PLATELET
Basophils Absolute: 0 10*3/uL (ref 0.0–0.1)
Basophils Relative: 0.6 % (ref 0.0–3.0)
Eosinophils Absolute: 0.1 10*3/uL (ref 0.0–0.7)
Eosinophils Relative: 2.8 % (ref 0.0–5.0)
HCT: 37.5 % (ref 36.0–46.0)
Hemoglobin: 12.1 g/dL (ref 12.0–15.0)
Lymphocytes Relative: 38.7 % (ref 12.0–46.0)
Lymphs Abs: 1.9 10*3/uL (ref 0.7–4.0)
MCHC: 32.2 g/dL (ref 30.0–36.0)
MCV: 84.8 fl (ref 78.0–100.0)
Monocytes Absolute: 0.7 10*3/uL (ref 0.1–1.0)
Monocytes Relative: 14.2 % — ABNORMAL HIGH (ref 3.0–12.0)
Neutro Abs: 2.1 10*3/uL (ref 1.4–7.7)
Neutrophils Relative %: 43.7 % (ref 43.0–77.0)
Platelets: 231 10*3/uL (ref 150.0–400.0)
RBC: 4.42 Mil/uL (ref 3.87–5.11)
RDW: 16.6 % — ABNORMAL HIGH (ref 11.5–15.5)
WBC: 4.9 10*3/uL (ref 4.0–10.5)

## 2021-01-14 LAB — COMPREHENSIVE METABOLIC PANEL WITH GFR
ALT: 40 U/L — ABNORMAL HIGH (ref 0–35)
AST: 27 U/L (ref 0–37)
Albumin: 3.9 g/dL (ref 3.5–5.2)
Alkaline Phosphatase: 87 U/L (ref 39–117)
BUN: 16 mg/dL (ref 6–23)
CO2: 28 meq/L (ref 19–32)
Calcium: 9.4 mg/dL (ref 8.4–10.5)
Chloride: 104 meq/L (ref 96–112)
Creatinine, Ser: 0.74 mg/dL (ref 0.40–1.20)
GFR: 93.19 mL/min (ref >60.00)
Glucose, Bld: 78 mg/dL (ref 70–99)
Potassium: 4 meq/L (ref 3.5–5.1)
Sodium: 138 meq/L (ref 135–145)
Total Bilirubin: 0.3 mg/dL (ref 0.2–1.2)
Total Protein: 6.6 g/dL (ref 6.0–8.3)

## 2021-01-14 LAB — LIPID PANEL
Cholesterol: 216 mg/dL — ABNORMAL HIGH (ref 0–200)
HDL: 71.1 mg/dL (ref >39.00)
LDL Cholesterol: 126 mg/dL — ABNORMAL HIGH (ref 0–99)
NonHDL: 145.06
Total CHOL/HDL Ratio: 3
Triglycerides: 96 mg/dL (ref 0.0–149.0)
VLDL: 19.2 mg/dL (ref 0.0–40.0)

## 2021-01-14 LAB — HEMOGLOBIN A1C: Hgb A1c MFr Bld: 5.5 % (ref 4.6–6.5)

## 2021-01-14 LAB — TSH: TSH: 0.86 u[IU]/mL (ref 0.35–4.50)

## 2021-01-14 MED ORDER — LORAZEPAM 1 MG PO TABS: ORAL_TABLET | ORAL | 1 refills | Status: DC

## 2021-01-14 MED ORDER — SERTRALINE HCL 25 MG PO TABS: 25.0000 mg | ORAL_TABLET | Freq: Every day | ORAL | 3 refills | Status: DC

## 2021-01-14 MED ORDER — HYDROXYZINE HCL 25 MG PO TABS: 25.0000 mg | ORAL_TABLET | Freq: Every evening | ORAL | 3 refills | Status: DC
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
## 2021-01-14 NOTE — Patient Instructions (Signed)
It was great to see you!  Decrease ativan to 1 mg daily.  Continue zoloft 25 mg daily. Continue hydroxyzine 50 mg nightly.  We are going to update your blood work today, I will be in touch with your results.  Let's follow-up in 6 months, sooner if you have concerns.  Take care,  [name] PA-C

## 2021-01-15 ENCOUNTER — Other Ambulatory Visit: Payer: Self-pay | Admitting: *Deleted

## 2021-01-15 MED ORDER — ATORVASTATIN CALCIUM 20 MG PO TABS: 20.0000 mg | ORAL_TABLET | Freq: Every day | ORAL | 1 refills | Status: DC

## 2021-01-15 NOTE — Progress Notes (Signed)
[place]ito

## 2021-01-22 ENCOUNTER — Other Ambulatory Visit: Payer: Self-pay | Admitting: Physician Assistant

## 2021-05-12 ENCOUNTER — Telehealth: Payer: Self-pay | Admitting: Internal Medicine

## 2021-05-12 NOTE — Telephone Encounter (Signed)
Called and spoke with pt to see if she thought she was having a reaction from one of her medications and pt said that she had recently switched laundry detergents and thought she was having a reaction from that. Stated to pt that she needed to call PCP and she verbalized understanding. Nothing further needed.

## 2021-05-13 ENCOUNTER — Other Ambulatory Visit: Payer: Self-pay

## 2021-05-13 ENCOUNTER — Encounter: Payer: Self-pay | Admitting: Registered Nurse

## 2021-05-13 ENCOUNTER — Ambulatory Visit (INDEPENDENT_AMBULATORY_CARE_PROVIDER_SITE_OTHER): Payer: Medicaid Other | Admitting: Registered Nurse

## 2021-05-13 VITALS — BP 109/66 | HR 90 | Temp 98.2°F | Resp 18 | Ht 62.0 in | Wt 191.0 lb

## 2021-05-13 DIAGNOSIS — J310 Chronic rhinitis: Secondary | ICD-10-CM

## 2021-05-13 DIAGNOSIS — F411 Generalized anxiety disorder: Secondary | ICD-10-CM

## 2021-05-13 DIAGNOSIS — T50905A Adverse effect of unspecified drugs, medicaments and biological substances, initial encounter: Secondary | ICD-10-CM

## 2021-05-13 DIAGNOSIS — R635 Abnormal weight gain: Secondary | ICD-10-CM

## 2021-05-13 DIAGNOSIS — F4310 Post-traumatic stress disorder, unspecified: Secondary | ICD-10-CM | POA: Diagnosis not present

## 2021-05-13 MED ORDER — PREDNISONE 20 MG PO TABS: 20.0000 mg | ORAL_TABLET | Freq: Every day | ORAL | 0 refills | Status: DC

## 2021-05-13 MED ORDER — PHENTERMINE HCL 30 MG PO CAPS: 30.0000 mg | ORAL_CAPSULE | ORAL | 1 refills | Status: DC

## 2021-05-13 MED ORDER — TRAZODONE HCL 50 MG PO TABS: 25.0000 mg | ORAL_TABLET | Freq: Every evening | ORAL | 3 refills | Status: DC | PRN

## 2021-05-13 MED ORDER — BUPROPION HCL ER (SR) 150 MG PO TB12
150.0000 mg | ORAL_TABLET | Freq: Every day | ORAL | 0 refills | Status: DC
Start: 2021-05-13 — End: 2021-06-25

## 2021-05-13 MED ORDER — LEVOCETIRIZINE DIHYDROCHLORIDE 5 MG PO TABS: ORAL_TABLET | ORAL | 5 refills | Status: DC
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
## 2021-05-13 NOTE — Patient Instructions (Addendum)
Ms. Christine Cunningham to meet you  Stop sertraline. Stop hydroxyzine  Start bupropion (wellbutrin) '150mg'$  XR once daily in the mornings. If you are tolerating this well and noting that it's not doing enough, after two weeks you can start taking it twice daily (once in the morning, once in the evening).  Start trazodone 25-'50mg'$  once daily at night as needed. Ok to take this every night, or just as needed.   Start phentermine '30mg'$  once daily in the morning. Ok to skip days. Do not take later than 10am   Check in with Christine Cunningham in about 6 weeks to see how this is working   Thank you  [name]     If you have lab work done today you will be contacted with your lab results within the next 2 weeks.  If you have not heard from [place] then please contact us. The fastest way to get your results is to register for My Chart.   IF you received an x-ray today, you will receive an invoice from [facility]. Please contact [facility] at [phone] with questions or concerns regarding your invoice.   IF you received labwork today, you will receive an invoice from [facility]. Please contact [facility] at [phone] with questions or concerns regarding your invoice.   Our billing staff will not be able to assist you with questions regarding bills from these companies.  You will be contacted with the lab results as soon as they are available. The fastest way to get your results is to activate your My Chart account. Instructions are located on the last page of this paperwork. If you have not heard from [place] regarding the results in 2 weeks, please contact this office.

## 2021-05-13 NOTE — Addendum Note (Signed)
Addended by: Maximiano Coss on: 05/13/2021 10:47 AM   Modules accepted: Orders

## 2021-06-05 ENCOUNTER — Ambulatory Visit (INDEPENDENT_AMBULATORY_CARE_PROVIDER_SITE_OTHER): Payer: Medicaid Other | Admitting: Physician Assistant

## 2021-06-05 ENCOUNTER — Encounter: Payer: Self-pay | Admitting: Physician Assistant

## 2021-06-05 ENCOUNTER — Other Ambulatory Visit: Payer: Self-pay

## 2021-06-05 VITALS — BP 127/76 | HR 96 | Temp 97.6°F | Ht 62.0 in | Wt 184.2 lb

## 2021-06-05 DIAGNOSIS — N3000 Acute cystitis without hematuria: Secondary | ICD-10-CM

## 2021-06-05 LAB — POC URINALSYSI DIPSTICK (AUTOMATED)
Bilirubin, UA: NEGATIVE
Blood, UA: POSITIVE
Glucose, UA: NEGATIVE
Ketones, UA: NEGATIVE
Nitrite, UA: NEGATIVE
Protein, UA: POSITIVE — AB
Spec Grav, UA: >=1.03 — AB (ref 1.010–1.025)
Urobilinogen, UA: 0.2 E.U./dL
pH, UA: 5.5 (ref 5.0–8.0)

## 2021-06-05 MED ORDER — PHENAZOPYRIDINE HCL 200 MG PO TABS: 200.0000 mg | ORAL_TABLET | Freq: Three times a day (TID) | ORAL | 0 refills | Status: DC | PRN

## 2021-06-05 MED ORDER — NITROFURANTOIN MONOHYD MACRO 100 MG PO CAPS: 100.0000 mg | ORAL_CAPSULE | Freq: Two times a day (BID) | ORAL | 0 refills | Status: AC
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
## 2021-06-06 ENCOUNTER — Other Ambulatory Visit: Payer: Medicaid Other

## 2021-06-07 LAB — URINE CULTURE
MICRO NUMBER:: 12327520
Result:: NO GROWTH
SPECIMEN QUALITY:: ADEQUATE

## 2021-06-24 ENCOUNTER — Other Ambulatory Visit: Payer: Self-pay | Admitting: Physician Assistant

## 2021-06-24 ENCOUNTER — Other Ambulatory Visit: Payer: Self-pay | Admitting: Internal Medicine

## 2021-06-24 DIAGNOSIS — Z1231 Encounter for screening mammogram for malignant neoplasm of breast: Secondary | ICD-10-CM

## 2021-06-25 ENCOUNTER — Other Ambulatory Visit: Payer: Self-pay | Admitting: Physician Assistant

## 2021-06-25 ENCOUNTER — Encounter: Payer: Self-pay | Admitting: Physician Assistant

## 2021-06-25 ENCOUNTER — Other Ambulatory Visit: Payer: Self-pay

## 2021-06-25 ENCOUNTER — Ambulatory Visit: Payer: Medicaid Other

## 2021-06-25 ENCOUNTER — Ambulatory Visit (INDEPENDENT_AMBULATORY_CARE_PROVIDER_SITE_OTHER): Payer: Medicaid Other | Admitting: Physician Assistant

## 2021-06-25 VITALS — BP 110/70 | HR 90 | Temp 97.9°F | Ht 62.0 in | Wt 184.4 lb

## 2021-06-25 DIAGNOSIS — Z23 Encounter for immunization: Secondary | ICD-10-CM

## 2021-06-25 DIAGNOSIS — M25561 Pain in right knee: Secondary | ICD-10-CM

## 2021-06-25 DIAGNOSIS — F411 Generalized anxiety disorder: Secondary | ICD-10-CM

## 2021-06-25 DIAGNOSIS — F4310 Post-traumatic stress disorder, unspecified: Secondary | ICD-10-CM

## 2021-06-25 DIAGNOSIS — E669 Obesity, unspecified: Secondary | ICD-10-CM

## 2021-06-25 DIAGNOSIS — M25562 Pain in left knee: Secondary | ICD-10-CM

## 2021-06-25 DIAGNOSIS — G8929 Other chronic pain: Secondary | ICD-10-CM

## 2021-06-25 MED ORDER — BUPROPION HCL ER (SR) 150 MG PO TB12: 150.0000 mg | ORAL_TABLET | Freq: Every day | ORAL | 1 refills | Status: DC

## 2021-06-25 MED ORDER — PHENTERMINE HCL 30 MG PO CAPS: 30.0000 mg | ORAL_CAPSULE | ORAL | 1 refills | Status: DC

## 2021-06-25 MED ORDER — CELECOXIB 100 MG PO CAPS: 100.0000 mg | ORAL_CAPSULE | Freq: Two times a day (BID) | ORAL | 0 refills | Status: DC

## 2021-06-25 MED ORDER — DICLOFENAC SODIUM 75 MG PO TBEC: 75.0000 mg | DELAYED_RELEASE_TABLET | Freq: Two times a day (BID) | ORAL | 1 refills | Status: DC

## 2021-06-25 MED ORDER — LORAZEPAM 0.5 MG PO TABS: 0.5000 mg | ORAL_TABLET | ORAL | 1 refills | Status: DC

## 2021-06-25 NOTE — Patient Instructions (Addendum)
It was great to see you!  Continue wellbutrin 150 mg daily Continue trazodone 50 mg daily  I have sent in a new script for your ativan. Take one 0.5 mg daily. After 3 months, you can cut your 0.5 mg Ativan tablets in half and take half of the tablet daily.  Do not stop this medication without discussing with me first.  I am also going to refill your phentermine today.  Stop mobic 15 mg Start diclofenac 75 mg twice daily (do not take ibuprofen while on this medication) for your knee pain  Let's follow-up in 3-6 months, sooner if you have concerns.  Take care,  [name] PA-C
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
## 2021-06-27 ENCOUNTER — Ambulatory Visit: Payer: Medicaid Other

## 2021-07-01 ENCOUNTER — Ambulatory Visit (INDEPENDENT_AMBULATORY_CARE_PROVIDER_SITE_OTHER): Payer: Medicaid Other | Admitting: Sports Medicine

## 2021-07-01 ENCOUNTER — Other Ambulatory Visit: Payer: Self-pay

## 2021-07-01 VITALS — BP 122/84 | HR 90 | Ht 62.0 in | Wt 180.0 lb

## 2021-07-01 DIAGNOSIS — M7712 Lateral epicondylitis, left elbow: Secondary | ICD-10-CM | POA: Diagnosis not present
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
## 2021-07-01 NOTE — Patient Instructions (Addendum)
Good to see you  Wrist brace given Tennis elbow home exercises Continue your pain medicine See me again in 4 weeks

## 2021-07-07 ENCOUNTER — Telehealth: Payer: Self-pay

## 2021-07-07 NOTE — Telephone Encounter (Signed)
Christine Cunningham said do not need to do Prior Authorization.

## 2021-07-09 ENCOUNTER — Other Ambulatory Visit: Payer: Self-pay | Admitting: Physician Assistant

## 2021-07-23 ENCOUNTER — Other Ambulatory Visit: Payer: Self-pay | Admitting: Physician Assistant

## 2021-07-25 ENCOUNTER — Other Ambulatory Visit: Payer: Self-pay | Admitting: Physician Assistant

## 2021-07-29 ENCOUNTER — Other Ambulatory Visit: Payer: Self-pay

## 2021-07-29 ENCOUNTER — Ambulatory Visit (INDEPENDENT_AMBULATORY_CARE_PROVIDER_SITE_OTHER): Payer: Medicaid Other | Admitting: Sports Medicine

## 2021-07-29 VITALS — BP 120/84 | HR 78 | Ht 62.0 in | Wt 178.0 lb

## 2021-07-29 DIAGNOSIS — Z8673 Personal history of transient ischemic attack (TIA), and cerebral infarction without residual deficits: Secondary | ICD-10-CM | POA: Diagnosis not present

## 2021-07-29 DIAGNOSIS — M6281 Muscle weakness (generalized): Secondary | ICD-10-CM | POA: Diagnosis not present

## 2021-07-29 DIAGNOSIS — M7712 Lateral epicondylitis, left elbow: Secondary | ICD-10-CM

## 2021-07-29 NOTE — Patient Instructions (Signed)
Good to see you  Referral to physical therapy they will call, but number provided below As needed follow up

## 2021-07-29 NOTE — Progress Notes (Signed)
Christine Cunningham D.[place] [employer] Phone: [phone]   Assessment and Plan:     1. Lateral epicondylitis of left elbow 2. History of cerebrovascular accident (CVA) in adulthood -Chronic with exacerbation, subsequent visit - Improving lateral epicondylitis of left elbow likely due to compensatory mechanism from residual weakness in left upper extremity from CVA - May continue Celebrex, however I would recommend using it as needed since her acute flare is improving - Start PT for strengthening of left upper extremity and tennis elbow.  Continue HEP   Pertinent previous records reviewed include none   Follow Up: As needed if no improvement or worsening of symptoms   Subjective:   I, Christine Cunningham, am serving as a scribe for Dr. Glennon Mac  Chief Complaint: Left arm pain   HPI:   07/01/21 Patient states that she has been having left arm pain X4 weeks after lifting furniture. Patient locates pain to Left forearm. was seen by Raliegh Ip Last month x rays negative and had an [place] that showed a sprained muscle was told it would heal in 8-6 weeks. Patient states that the pain is worse at night.    Numbness/tingling: no Weakness: yes in the left hand Aggravates: twisting, bending, lifting  Treatments tried: Voltaren gel  07/29/21 Patient states that her arm still is sore and gets stiff. Is better than it was last time is able to turn her her arm.   Relevant Historical Information: History of CVA with left-sided residual weakness  Additional pertinent review of systems negative.   Current Outpatient Medications:    atorvastatin (LIPITOR) 20 MG tablet, TAKE 1 TABLET(20 MG) BY MOUTH DAILY, Disp: 90 tablet, Rfl: 1   azelastine (ASTELIN) 0.1 % nasal spray, Place 1 spray into both nostrils 2 (two) times daily. Use in each nostril as directed, Disp: 30 mL, Rfl: 12   buPROPion (WELLBUTRIN SR) 150 MG 12 hr tablet,  Take 1 tablet (150 mg total) by mouth daily., Disp: 90 tablet, Rfl: 1   celecoxib (CELEBREX) 100 MG capsule, TAKE 1 CAPSULE(100 MG) BY MOUTH TWICE DAILY, Disp: 60 capsule, Rfl: 0   fluticasone (FLOVENT DISKUS) 50 MCG/BLIST diskus inhaler, Inhale 1 puff into the lungs 2 (two) times daily., Disp: 1 each, Rfl: 12   furosemide (LASIX) 20 MG tablet, Take 1 tablet (20 mg total) by mouth daily as needed for edema., Disp: 30 tablet, Rfl: 3   ibuprofen (ADVIL,MOTRIN) 600 MG tablet, Take 1 tablet (600 mg total) by mouth every 6 (six) hours as needed., Disp: 30 tablet, Rfl: 0   levocetirizine (XYZAL) 5 MG tablet, TAKE 1 TABLET BY MOUTH DAILY, Disp: 30 tablet, Rfl: 5   LORazepam (ATIVAN) 0.5 MG tablet, Take 1 tablet (0.5 mg total) by mouth every morning., Disp: 90 tablet, Rfl: 1   montelukast (SINGULAIR) 10 MG tablet, TAKE 1 TABLET(10 MG) BY MOUTH AT BEDTIME, Disp: 30 tablet, Rfl: 5   phentermine 30 MG capsule, Take 1 capsule (30 mg total) by mouth every morning., Disp: 30 capsule, Rfl: 1   PROAIR HFA 108 (90 Base) MCG/ACT inhaler, INHALE 1 TO 2 PUFFS INTO THE LUNGS EVERY 6 HOURS AS NEEDED FOR WHEEZING OR SHORTNESS OF BREATH, Disp: 17 g, Rfl: 0   traZODone (DESYREL) 50 MG tablet, Take 0.5-1 tablets (25-50 mg total) by mouth at bedtime as needed for sleep., Disp: 30 tablet, Rfl: 3   Objective:     Vitals:   07/29/21 9417  BP: 120/84  Pulse: 78  SpO2: 99%  Weight: 178 lb (80.7 kg)  Height: 5\' 2"  (1.575 m)      Body mass index is 32.56 kg/m.    Physical Exam:    General: Appears well, no acute distress, nontoxic and pleasant Neck: FROM, no pain Neuro: sensation is intact distally with no deficits, strenghth is 4+/5 in elbow flexors/extenders/supinator/pronators and wrist flexors/extensors Psych: no evidence of anxiety or depression  Left ELBOW: no deformity, swelling or muscle wasting Normal Carrying angle ROM:0-140, supination and pronation 90 NTTP over triceps, ticeps tendon, olecronon, lat  epicondyle, medial epicondyle, antecubital fossa, biceps tendon, supinator, pronator Negative tinnels over cubital tunnel No pain with resisted wrist and middle digit extension No pain with resisted wrist flexion No pain with resisted supination No pain with resisted pronation Negative valgus stress Negative varus stress Negative milking maneuver    Electronically signed by:  Christine Cunningham D.[facility] 9:47 AM 07/29/21

## 2021-07-30 ENCOUNTER — Ambulatory Visit: Payer: Medicaid Other

## 2021-08-01 ENCOUNTER — Ambulatory Visit: Payer: Medicaid Other

## 2021-08-06 ENCOUNTER — Ambulatory Visit
Admission: RE | Admit: 2021-08-06 | Discharge: 2021-08-06 | Disposition: A | Discharge status: Home / Self Care | Payer: Medicaid Other | Source: Ambulatory Visit | Attending: Physician Assistant | Admitting: Physician Assistant

## 2021-08-06 ENCOUNTER — Other Ambulatory Visit: Payer: Self-pay

## 2021-08-06 DIAGNOSIS — Z1231 Encounter for screening mammogram for malignant neoplasm of breast: Secondary | ICD-10-CM

## 2021-08-07 ENCOUNTER — Telehealth: Payer: Self-pay

## 2021-08-07 ENCOUNTER — Other Ambulatory Visit: Payer: Self-pay | Admitting: Physician Assistant

## 2021-08-07 NOTE — Telephone Encounter (Signed)
Patient is scheduled   

## 2021-08-07 NOTE — Telephone Encounter (Signed)
Patient is calling in stating that the 0.5 of Ativan is not helping her, asking to go up to 1 MG.

## 2021-08-08 ENCOUNTER — Other Ambulatory Visit: Payer: Self-pay

## 2021-08-08 ENCOUNTER — Ambulatory Visit (INDEPENDENT_AMBULATORY_CARE_PROVIDER_SITE_OTHER): Payer: Medicaid Other | Admitting: Physician Assistant

## 2021-08-08 ENCOUNTER — Encounter: Payer: Self-pay | Admitting: Physician Assistant

## 2021-08-08 VITALS — BP 110/78 | HR 77 | Temp 98.3°F | Ht 62.0 in | Wt 181.4 lb

## 2021-08-08 DIAGNOSIS — F411 Generalized anxiety disorder: Secondary | ICD-10-CM

## 2021-08-08 DIAGNOSIS — E669 Obesity, unspecified: Secondary | ICD-10-CM

## 2021-08-08 MED ORDER — LORAZEPAM 0.5 MG PO TABS: 0.5000 mg | ORAL_TABLET | Freq: Every day | ORAL | 0 refills | Status: DC

## 2021-08-08 MED ORDER — BUSPIRONE HCL 10 MG PO TABS: 10.0000 mg | ORAL_TABLET | Freq: Two times a day (BID) | ORAL | 1 refills | Status: DC | PRN

## 2021-08-08 NOTE — Patient Instructions (Addendum)
It was great to see you!  Continue the ativan 0.5 mg every morning  Start buspar 10 mg twice daily as needed  If you take this for a few days and do not like it, please call me and let me know  Otherwise, lets see each other in a month  Take care,  [name] PA-C
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
## 2021-08-13 ENCOUNTER — Telehealth (INDEPENDENT_AMBULATORY_CARE_PROVIDER_SITE_OTHER): Payer: Medicaid Other | Admitting: Physician Assistant

## 2021-08-13 ENCOUNTER — Encounter: Payer: Self-pay | Admitting: Physician Assistant

## 2021-08-13 VITALS — Ht 62.0 in | Wt 181.4 lb

## 2021-08-13 DIAGNOSIS — F411 Generalized anxiety disorder: Secondary | ICD-10-CM | POA: Diagnosis not present

## 2021-08-13 DIAGNOSIS — J329 Chronic sinusitis, unspecified: Secondary | ICD-10-CM

## 2021-08-13 MED ORDER — AMOXICILLIN-POT CLAVULANATE 875-125 MG PO TABS: 1.0000 | ORAL_TABLET | Freq: Two times a day (BID) | ORAL | 0 refills | Status: DC

## 2021-08-13 MED ORDER — LORAZEPAM 0.5 MG PO TABS: 0.5000 mg | ORAL_TABLET | Freq: Every day | ORAL | 1 refills | Status: DC
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
## 2021-09-04 ENCOUNTER — Telehealth: Payer: Self-pay

## 2021-09-04 NOTE — Telephone Encounter (Signed)
Error
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
## 2021-09-10 ENCOUNTER — Telehealth: Payer: Self-pay

## 2021-09-10 ENCOUNTER — Other Ambulatory Visit: Payer: Self-pay

## 2021-09-10 ENCOUNTER — Ambulatory Visit: Payer: Medicaid Other | Admitting: Physician Assistant

## 2021-09-10 ENCOUNTER — Encounter: Payer: Self-pay | Admitting: Physician Assistant

## 2021-09-10 VITALS — BP 122/74 | HR 83 | Temp 97.9°F | Ht 65.0 in | Wt 180.0 lb

## 2021-09-10 DIAGNOSIS — J4520 Mild intermittent asthma, uncomplicated: Secondary | ICD-10-CM

## 2021-09-10 DIAGNOSIS — F411 Generalized anxiety disorder: Secondary | ICD-10-CM

## 2021-09-10 DIAGNOSIS — J310 Chronic rhinitis: Secondary | ICD-10-CM

## 2021-09-10 DIAGNOSIS — E663 Overweight: Secondary | ICD-10-CM

## 2021-09-10 DIAGNOSIS — M7712 Lateral epicondylitis, left elbow: Secondary | ICD-10-CM

## 2021-09-10 DIAGNOSIS — E785 Hyperlipidemia, unspecified: Secondary | ICD-10-CM | POA: Diagnosis not present

## 2021-09-10 LAB — LIPID PANEL
Cholesterol: 141 mg/dL (ref 0–200)
HDL: 55.8 mg/dL (ref >39.00)
LDL Cholesterol: 71 mg/dL (ref 0–99)
NonHDL: 85.21
Total CHOL/HDL Ratio: 3
Triglycerides: 72 mg/dL (ref 0.0–149.0)
VLDL: 14.4 mg/dL (ref 0.0–40.0)

## 2021-09-10 LAB — BASIC METABOLIC PANEL
BUN: 13 mg/dL (ref 6–23)
CO2: 29 mEq/L (ref 19–32)
Calcium: 9.6 mg/dL (ref 8.4–10.5)
Chloride: 103 mEq/L (ref 96–112)
Creatinine, Ser: 0.8 mg/dL (ref 0.40–1.20)
GFR: 84.48 mL/min (ref >60.00)
Glucose, Bld: 94 mg/dL (ref 70–99)
Potassium: 4 mEq/L (ref 3.5–5.1)
Sodium: 138 mEq/L (ref 135–145)

## 2021-09-10 MED ORDER — MONTELUKAST SODIUM 10 MG PO TABS: ORAL_TABLET | ORAL | 5 refills | Status: DC

## 2021-09-10 MED ORDER — PHENTERMINE HCL 30 MG PO CAPS: 30.0000 mg | ORAL_CAPSULE | ORAL | 1 refills | Status: DC

## 2021-09-10 MED ORDER — FUROSEMIDE 20 MG PO TABS: 20.0000 mg | ORAL_TABLET | Freq: Every day | ORAL | 3 refills | Status: DC | PRN

## 2021-09-10 MED ORDER — PROAIR HFA 108 (90 BASE) MCG/ACT IN AERS: INHALATION_SPRAY | RESPIRATORY_TRACT | 0 refills | Status: DC

## 2021-09-10 MED ORDER — LORAZEPAM 0.5 MG PO TABS: 0.2500 mg | ORAL_TABLET | Freq: Every day | ORAL | 1 refills | Status: DC

## 2021-09-10 MED ORDER — PILL SPLITTER MISC: 1.0000 | Freq: Once | 0 refills | Status: AC

## 2021-09-10 MED ORDER — BUSPIRONE HCL 10 MG PO TABS: 10.0000 mg | ORAL_TABLET | Freq: Two times a day (BID) | ORAL | 1 refills | Status: DC | PRN

## 2021-09-10 MED ORDER — AZELASTINE HCL 0.1 % NA SOLN: 1.0000 | Freq: Two times a day (BID) | NASAL | 12 refills | Status: DC

## 2021-09-10 NOTE — Telephone Encounter (Signed)
Patient has called back to inquire on lab results.  I have read her Sam's response.  Patient understands.  Would like to know if Sam would like her to continue taking atorvastatin?

## 2021-09-10 NOTE — Patient Instructions (Signed)
It was great to see you!  For your anxiety: Start cutting tablets in half and take 0.25 mg daily Continue this until I see you again May take additional 10 mg buspar in the morning for anxiety if needed Continue evening buspar 10 mg daily   Follow-up with me in 3 months, sooner if concerns. Refills have been sent today. We will check your cholesterol today also.  [name] PA-C

## 2021-09-12 NOTE — Telephone Encounter (Signed)
Please advise 

## 2021-09-12 NOTE — Telephone Encounter (Signed)
Patient notified and verbalized understanding. 

## 2021-10-23 ENCOUNTER — Telehealth: Payer: Self-pay

## 2021-10-23 NOTE — Telephone Encounter (Signed)
Due to weather changes, having some sinus pressure and drainage. Would something called if possible.

## 2021-10-24 ENCOUNTER — Encounter: Payer: Self-pay | Admitting: Physician Assistant

## 2021-10-24 ENCOUNTER — Other Ambulatory Visit: Payer: Self-pay

## 2021-10-24 ENCOUNTER — Telehealth (INDEPENDENT_AMBULATORY_CARE_PROVIDER_SITE_OTHER): Payer: Medicaid Other | Admitting: Physician Assistant

## 2021-10-24 VITALS — Ht 65.0 in | Wt 176.0 lb

## 2021-10-24 DIAGNOSIS — J310 Chronic rhinitis: Secondary | ICD-10-CM | POA: Diagnosis not present

## 2021-10-24 MED ORDER — AMOXICILLIN-POT CLAVULANATE 875-125 MG PO TABS: 1.0000 | ORAL_TABLET | Freq: Two times a day (BID) | ORAL | 0 refills | Status: DC

## 2021-10-24 NOTE — Telephone Encounter (Signed)
Patient has been scheduled

## 2021-10-24 NOTE — Telephone Encounter (Signed)
Please see message and advise 
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
## 2021-11-06 ENCOUNTER — Other Ambulatory Visit: Payer: Self-pay

## 2021-11-06 MED ORDER — LORAZEPAM 0.5 MG PO TABS: 0.2500 mg | ORAL_TABLET | Freq: Every day | ORAL | 2 refills | Status: DC

## 2021-11-06 NOTE — Telephone Encounter (Signed)
Pt requesting refill on Lorazepam 0.5 mg. Last OV 10/24/2021.

## 2021-11-06 NOTE — Telephone Encounter (Signed)
..   Encourage patient to contact the pharmacy for refills or they can request refills through [facility]:  09/10/21  NEXT APPOINTMENT DATE: 12/15/21  MEDICATION: lorazepam Is the patient out of medication?   PHARMACY: [facility] on [address]   Let patient know to contact pharmacy at the end of the day to make sure medication is ready.  Please notify patient to allow 48-72 hours to process  CLINICAL FILLS OUT ALL BELOW:   LAST REFILL:  QTY:  REFILL DATE:    OTHER COMMENTS:    Okay for refill?  Please advise

## 2021-11-12 IMAGING — MG MM DIGITAL SCREENING BILAT W/ TOMO AND CAD
8 series · 9 of 24 positions shown · non-contrast
Comparison: Previous exam(s).

CLINICAL DATA: Screening.

EXAM:
DIGITAL SCREENING BILATERAL MAMMOGRAM WITH TOMOSYNTHESIS AND CAD
TECHNIQUE: Bilateral screening digital craniocaudal and mediolateral oblique
mammograms were obtained. Bilateral screening digital breast
tomosynthesis was performed. The images were evaluated with
computer-aided detection.

[R CC synth-2D]
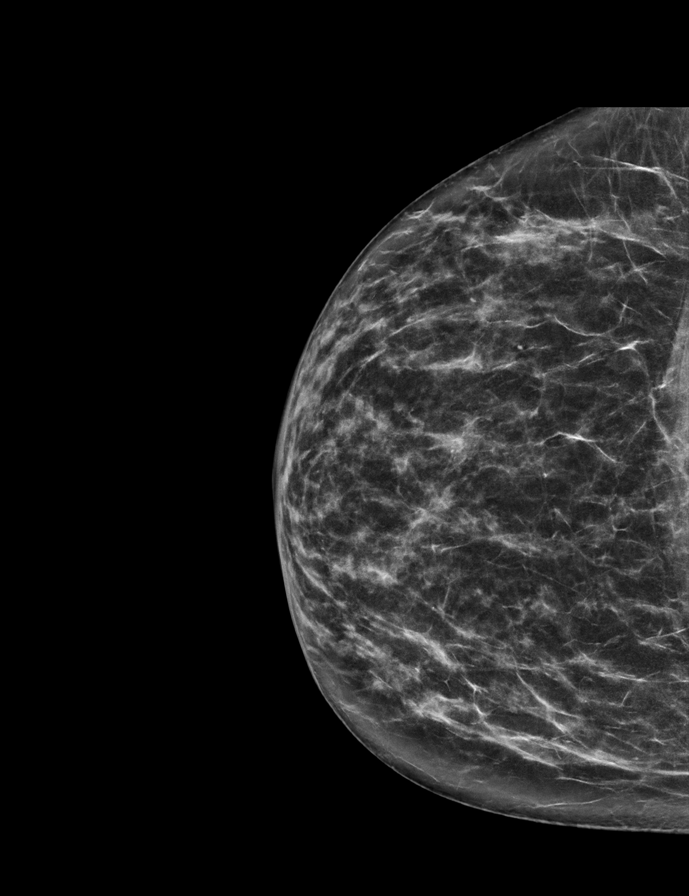

[R MLO synth-2D]
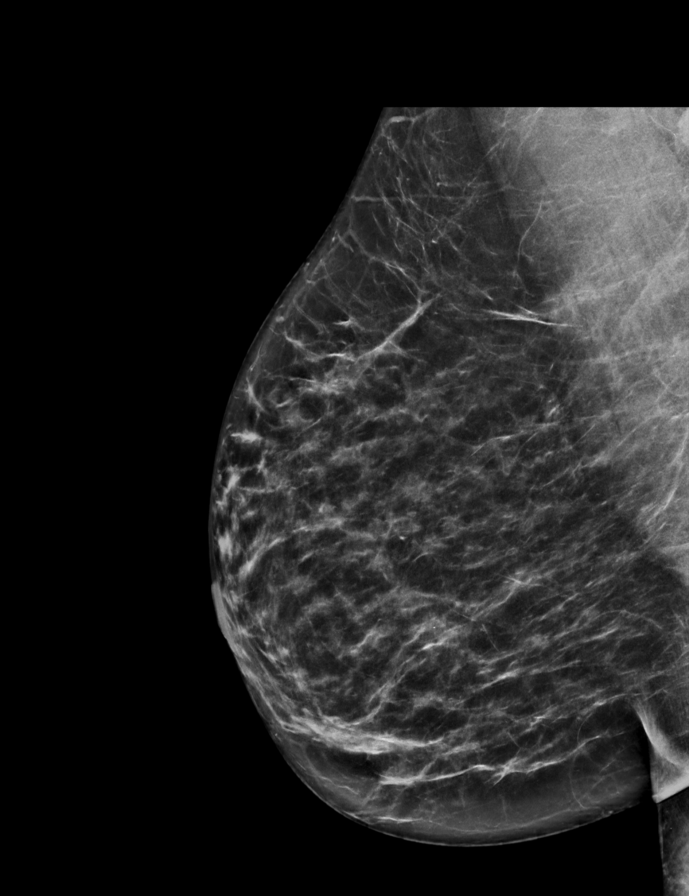

[L MLO synth-2D]
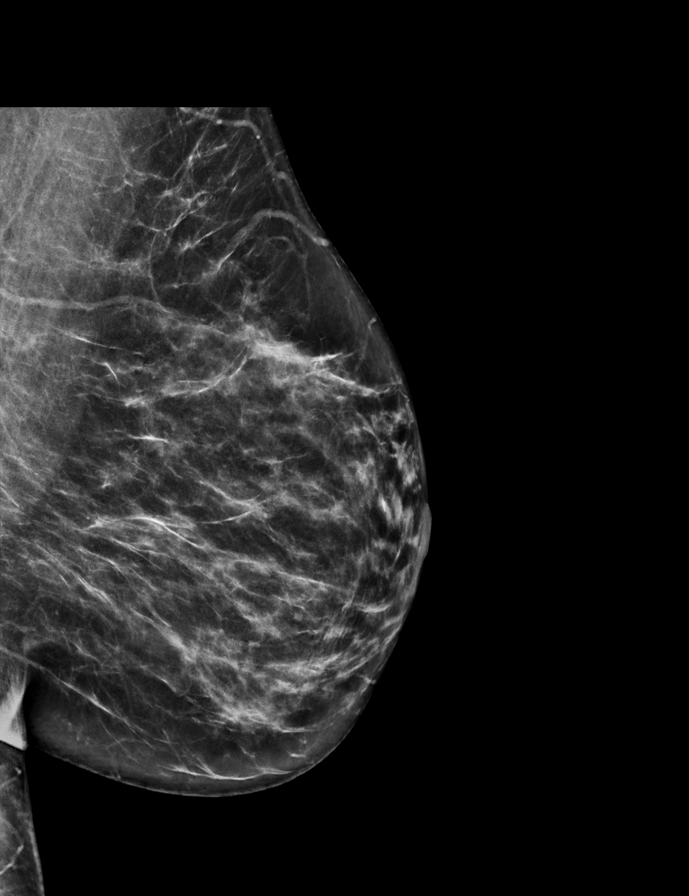

[L CC synth-2D]
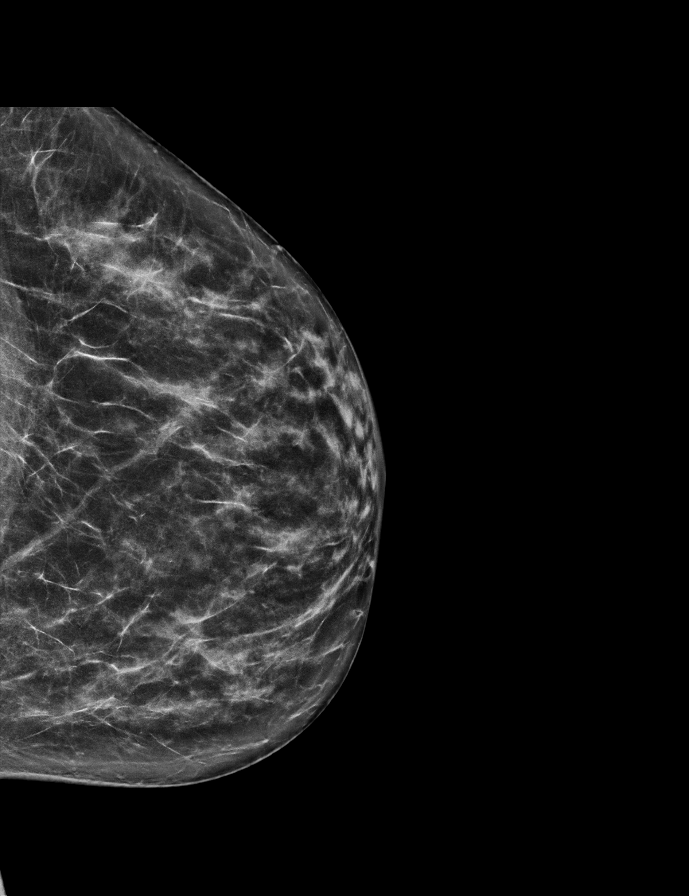

[R CC tomo · 2 of 62 frames shown]
[frame 21/62]
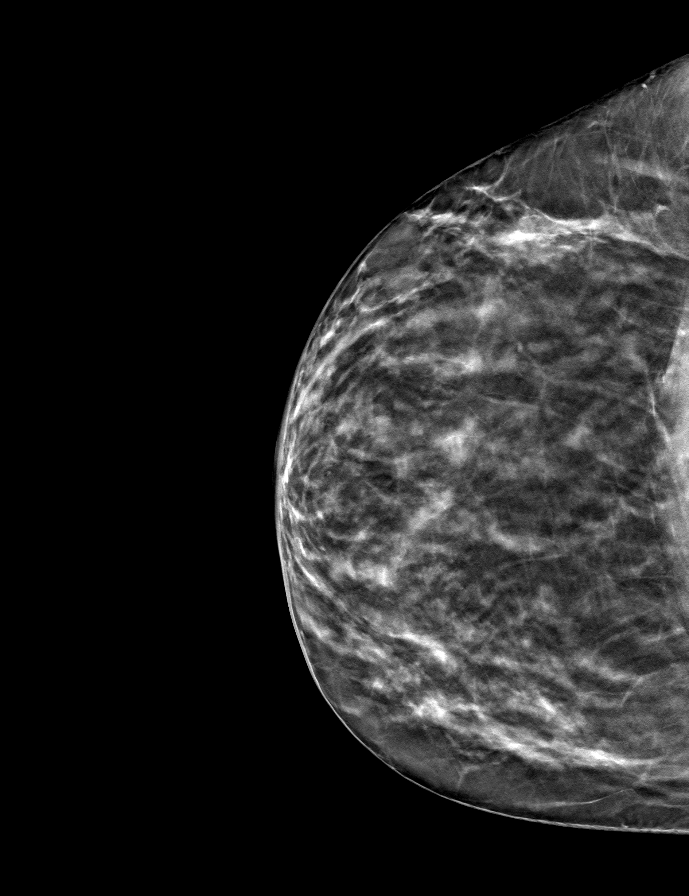
[frame 31/62]
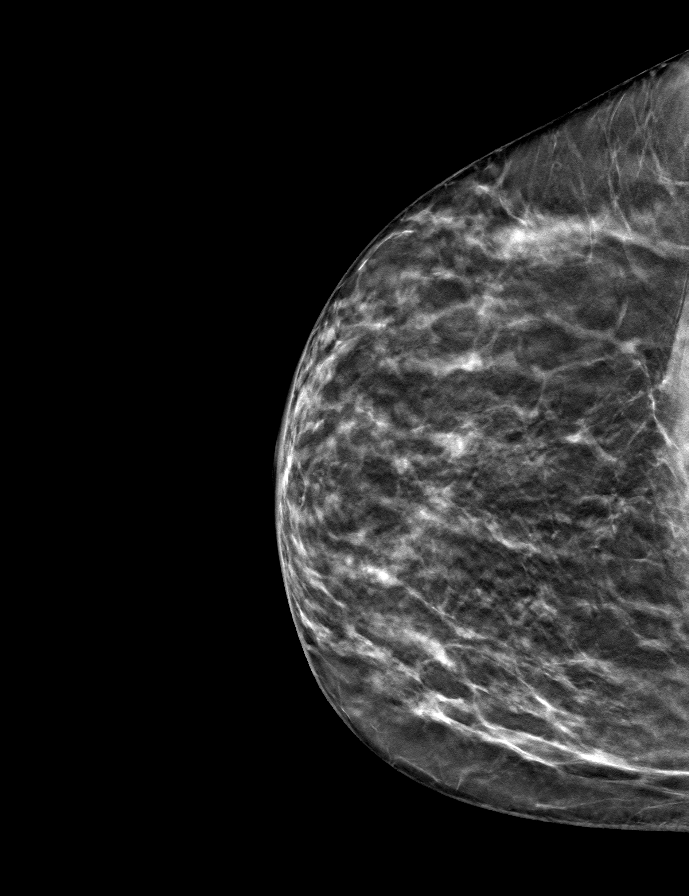

[L CC tomo · tomo slice 31/62.0]
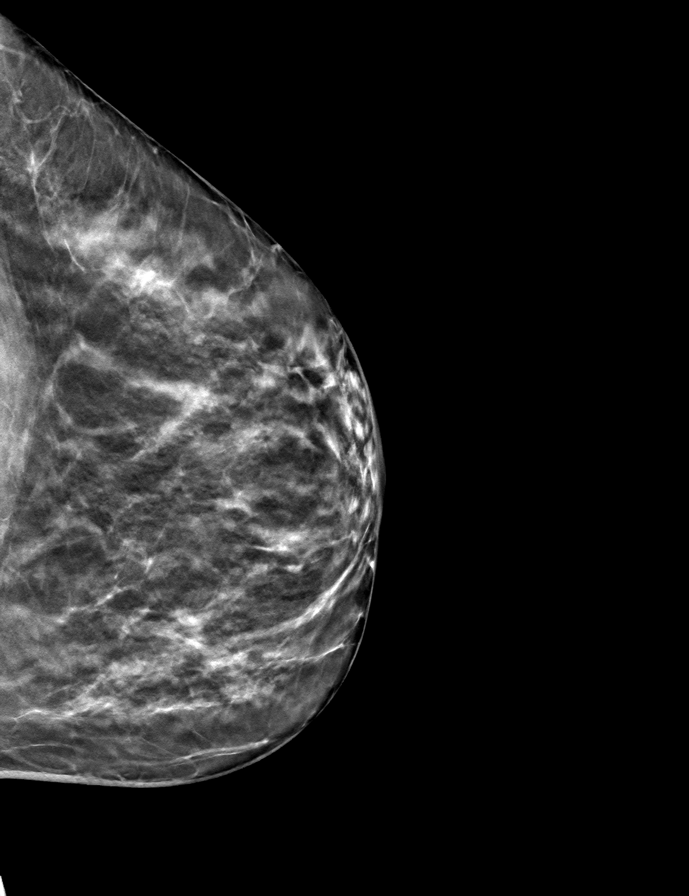

[R MLO tomo · tomo slice 34/67.0]
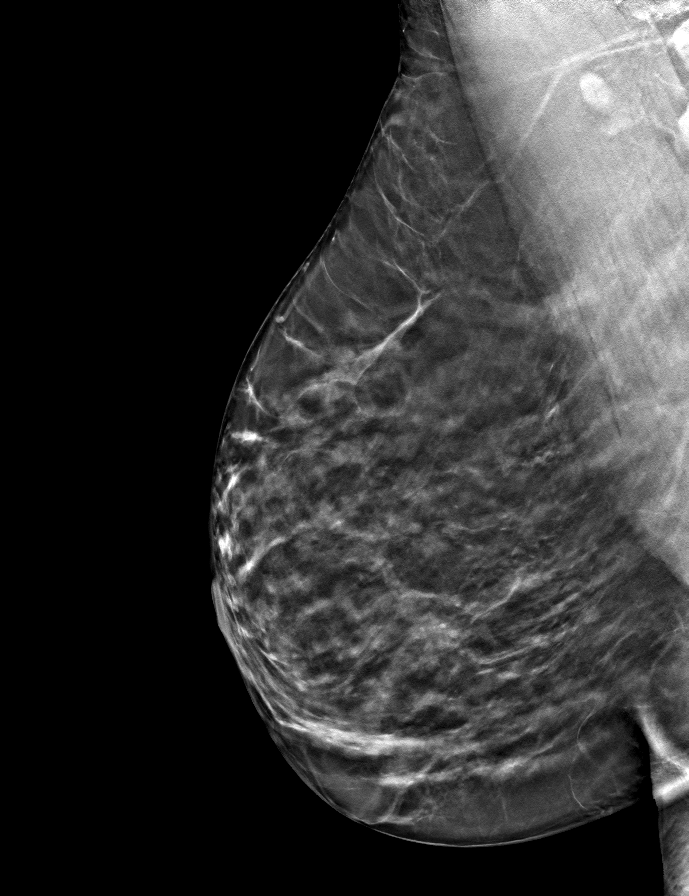

[L MLO tomo · tomo slice 34/67.0]
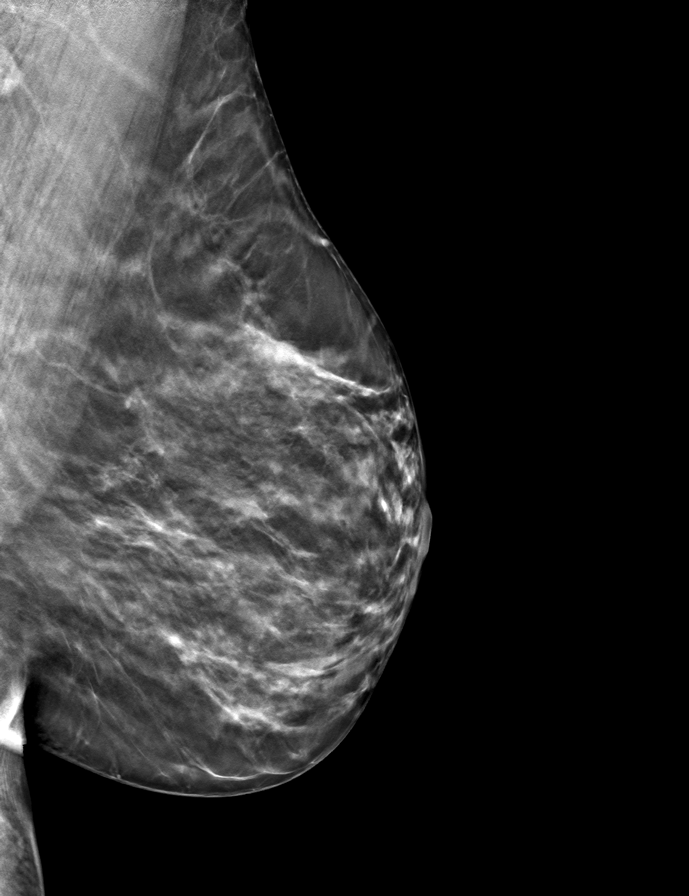

[9 of 24 positions shown; findings below may reference images not displayed]

ACR Breast Density Category c: The breast tissue is heterogeneously
dense, which may obscure small masses.
FINDINGS: There are no findings suspicious for malignancy.
IMPRESSION: No mammographic evidence of malignancy. A result letter of this
screening mammogram will be mailed directly to the patient.

RECOMMENDATION:
Screening mammogram in one year. (Code:Q3-W-BC3)

BI-RADS CATEGORY  1: Negative.

## 2021-12-05 ENCOUNTER — Other Ambulatory Visit: Payer: Self-pay | Admitting: Registered Nurse

## 2021-12-05 DIAGNOSIS — J310 Chronic rhinitis: Secondary | ICD-10-CM

## 2021-12-15 ENCOUNTER — Encounter: Payer: Self-pay | Admitting: Physician Assistant

## 2021-12-15 ENCOUNTER — Ambulatory Visit (INDEPENDENT_AMBULATORY_CARE_PROVIDER_SITE_OTHER): Payer: Medicaid Other | Admitting: Physician Assistant

## 2021-12-15 VITALS — BP 110/75 | HR 80 | Temp 97.0°F | Ht 65.0 in | Wt 180.8 lb

## 2021-12-15 DIAGNOSIS — F411 Generalized anxiety disorder: Secondary | ICD-10-CM | POA: Diagnosis not present

## 2021-12-15 DIAGNOSIS — J310 Chronic rhinitis: Secondary | ICD-10-CM | POA: Diagnosis not present

## 2021-12-15 DIAGNOSIS — M7989 Other specified soft tissue disorders: Secondary | ICD-10-CM

## 2021-12-15 DIAGNOSIS — E669 Obesity, unspecified: Secondary | ICD-10-CM | POA: Diagnosis not present

## 2021-12-15 LAB — BASIC METABOLIC PANEL
BUN: 12 mg/dL (ref 6–23)
CO2: 26 mEq/L (ref 19–32)
Calcium: 9.7 mg/dL (ref 8.4–10.5)
Chloride: 104 mEq/L (ref 96–112)
Creatinine, Ser: 0.83 mg/dL (ref 0.40–1.20)
GFR: 80.68 mL/min (ref >60.00)
Glucose, Bld: 93 mg/dL (ref 70–99)
Potassium: 4.2 mEq/L (ref 3.5–5.1)
Sodium: 138 mEq/L (ref 135–145)

## 2021-12-15 MED ORDER — LORAZEPAM 0.5 MG PO TABS: 0.5000 mg | ORAL_TABLET | Freq: Every day | ORAL | 2 refills | Status: DC

## 2021-12-15 MED ORDER — FUROSEMIDE 20 MG PO TABS: 20.0000 mg | ORAL_TABLET | Freq: Every day | ORAL | 3 refills | Status: DC | PRN

## 2021-12-15 MED ORDER — MONTELUKAST SODIUM 10 MG PO TABS: ORAL_TABLET | ORAL | 1 refills | Status: DC
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
## 2021-12-15 NOTE — Patient Instructions (Signed)
It was great to see you! ? ?-Continue 1 tablet of lorazepam in the morning ?-If needed, for your anxiety, you can take a 10 mg hydroxyzine tablet - I have sent this in for you ? ?Trial Wegovy weekly injection and come back and see me in 1 month ?STOP phentermine ? ?I have put in a referral for you to be contacted about the exercise program at the [facility] ? ?Let's follow-up in 1 month, sooner if you have concerns. ? ?If a referral was placed today, you will be contacted for an appointment. Please note that routine referrals can sometimes take up to 3-4 weeks to process. Please call our office if you haven't heard anything after this time frame. ? ?Take care, ? ?[name] PA-C  ?

## 2021-12-19 ENCOUNTER — Telehealth: Payer: Self-pay

## 2021-12-19 NOTE — Telephone Encounter (Signed)
LVMT pt requesting call back to discuss PREP referral 

## 2021-12-24 ENCOUNTER — Telehealth: Payer: Self-pay | Admitting: Physician Assistant

## 2021-12-24 NOTE — Telephone Encounter (Signed)
Pt states she is having issues with the pharmacy. She has only been receiving half refills and has not received a reason why. She is asking for someone from our office to check on this. Please advise ?

## 2021-12-25 NOTE — Telephone Encounter (Signed)
LVM for pt to let her know that apparently the pharmacy was filling an old script but they said they they will put the new script in and she should be able to pick up. ?

## 2021-12-28 ENCOUNTER — Other Ambulatory Visit: Payer: Self-pay | Admitting: Physician Assistant

## 2022-01-06 ENCOUNTER — Telehealth: Payer: Self-pay

## 2022-01-06 NOTE — Telephone Encounter (Signed)
Patient called in and states she took her last wegovy shot today wanted to know if [facility] would send in another month for her.  ? ?

## 2022-01-08 ENCOUNTER — Other Ambulatory Visit: Payer: Self-pay

## 2022-01-08 DIAGNOSIS — E669 Obesity, unspecified: Secondary | ICD-10-CM

## 2022-01-08 MED ORDER — WEGOVY 0.25 MG/0.5ML ~~LOC~~ SOAJ: 0.2500 mg | SUBCUTANEOUS | 2 refills | Status: DC

## 2022-01-08 NOTE — Telephone Encounter (Signed)
Message sent thru [place] making the pt aware that if we can not get authorized thru insurance, we will not longer be able to provide samples. ?

## 2022-01-12 ENCOUNTER — Telehealth: Payer: Self-pay | Admitting: *Deleted

## 2022-01-12 NOTE — Telephone Encounter (Signed)
(  Key: [id number]) ?Rx #: [id number] ?Wegovy 0.'25MG'$ /0.5ML auto-injectors ?Waiting for determination  ?

## 2022-01-13 ENCOUNTER — Other Ambulatory Visit: Payer: Self-pay | Admitting: Physician Assistant

## 2022-01-13 NOTE — Telephone Encounter (Signed)
[id number]. WEGOVY INJ 0.'25MG'$  is denied for not meeting the prior authorization requiremen ?

## 2022-01-15 ENCOUNTER — Ambulatory Visit (INDEPENDENT_AMBULATORY_CARE_PROVIDER_SITE_OTHER): Payer: Medicaid Other | Admitting: Physician Assistant

## 2022-01-15 ENCOUNTER — Encounter: Payer: Self-pay | Admitting: Physician Assistant

## 2022-01-15 VITALS — BP 91/61 | HR 76 | Temp 97.7°F | Ht 65.0 in | Wt 179.8 lb

## 2022-01-15 DIAGNOSIS — R21 Rash and other nonspecific skin eruption: Secondary | ICD-10-CM | POA: Diagnosis not present

## 2022-01-15 DIAGNOSIS — F4310 Post-traumatic stress disorder, unspecified: Secondary | ICD-10-CM | POA: Diagnosis not present

## 2022-01-15 DIAGNOSIS — F411 Generalized anxiety disorder: Secondary | ICD-10-CM | POA: Diagnosis not present

## 2022-01-15 DIAGNOSIS — E663 Overweight: Secondary | ICD-10-CM | POA: Diagnosis not present

## 2022-01-15 MED ORDER — LORAZEPAM 0.5 MG PO TABS: 0.5000 mg | ORAL_TABLET | Freq: Every day | ORAL | 2 refills | Status: DC

## 2022-01-15 MED ORDER — PHENTERMINE HCL 30 MG PO CAPS: 30.0000 mg | ORAL_CAPSULE | ORAL | 2 refills | Status: DC

## 2022-01-15 MED ORDER — TRIAMCINOLONE ACETONIDE 0.1 % EX CREA: TOPICAL_CREAM | CUTANEOUS | 0 refills | Status: AC

## 2022-01-15 MED ORDER — HYDROXYZINE HCL 25 MG PO TABS: 25.0000 mg | ORAL_TABLET | Freq: Every evening | ORAL | 2 refills | Status: DC

## 2022-01-15 NOTE — Telephone Encounter (Signed)
Pt was told by Rosine Door that medication Mancel Parsons was not approved and discussed at appt today. ?
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
## 2022-01-15 NOTE — Patient Instructions (Signed)
It was great to see you! ? ?Try to call the [facility] to get started with the exercise class that I referred you to ? ?Restart phentermine and followup with me in 3 months for this ? ?Use ointment on your rash ? ?I have increased your hydroxyzine to 25 mg  ? ?Drink 4 bottles of water a day ? ?Take care, ? ?[name] PA-C  ?
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
## 2022-01-27 NOTE — Progress Notes (Signed)
[facility] PREP Weekly Session ? ?Patient Details  ?Name: Christine Cunningham ?MRN: [id number] ?Date of Birth: 24-Jun-1971 ?Age: 51 y.o. ?PCP: [name], PA ? ?There were no vitals filed for this visit. ? ? [facility] Weekly seesion - 01/27/22 1700   ? ?  ? [facility] "PREP" Location  ? [facility] "PREP[facility] Location [place]   ?  ? Weekly Session  ? Topic Discussed Goal setting and welcome to the program   Scale of perceived exertion and orientation to [facility]  ? Classes attended to date 1   ? ?  ?  ? ?  ? ? ?Barnett Hatter ?01/27/2022, 5:13 PM ? ? ?

## 2022-02-05 NOTE — Progress Notes (Signed)
[facility] PREP Weekly Session ? ?Patient Details  ?Name: Christine Cunningham ?MRN: [id number] ?Date of Birth: Jan 17, 1971 ?Age: 51 y.o. ?PCP: [name], PA ? ?Vitals:  ? 02/02/22 1300  ?Weight: 182 lb (82.6 kg)  ? ? ? [facility] Weekly seesion - 02/05/22 1600   ? ?  ? [facility]PREP[facility]  ? [facility] "PREP[facility] Location [place]   ?  ? Weekly Session  ? Topic Discussed Importance of resistance training;Other ways to be active   ? Minutes exercised this week 30 minutes   ? Classes attended to date 4   ? ?  ?  ? ?  ? ? ?Barnett Hatter ?02/05/2022, 4:30 PM ? ? ?

## 2022-02-26 ENCOUNTER — Encounter: Payer: Self-pay | Admitting: Physician Assistant

## 2022-02-26 ENCOUNTER — Ambulatory Visit (INDEPENDENT_AMBULATORY_CARE_PROVIDER_SITE_OTHER): Payer: Medicaid Other | Admitting: Physician Assistant

## 2022-02-26 VITALS — BP 110/68 | HR 89 | Temp 98.1°F | Ht 65.0 in | Wt 182.2 lb

## 2022-02-26 DIAGNOSIS — F411 Generalized anxiety disorder: Secondary | ICD-10-CM

## 2022-02-26 DIAGNOSIS — J329 Chronic sinusitis, unspecified: Secondary | ICD-10-CM | POA: Diagnosis not present

## 2022-02-26 MED ORDER — AMOXICILLIN-POT CLAVULANATE 875-125 MG PO TABS: 1.0000 | ORAL_TABLET | Freq: Two times a day (BID) | ORAL | 0 refills | Status: DC

## 2022-02-26 MED ORDER — LORAZEPAM 0.5 MG PO TABS: 0.5000 mg | ORAL_TABLET | Freq: Every day | ORAL | 0 refills | Status: AC
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
## 2022-02-26 NOTE — Addendum Note (Signed)
Addended by: Erlene Quan on: 02/26/2022 12:09 PM   Modules accepted: Orders

## 2022-02-26 NOTE — Patient Instructions (Signed)
It was great to see you!  Start Augmentin antibiotic  Consider Vitamin D supplement, any brand is fine and any dose is fine  If you find out where your brother went for surgery, let me know.  Take care,  [name] PA-C

## 2022-03-23 ENCOUNTER — Other Ambulatory Visit: Payer: Self-pay | Admitting: *Deleted

## 2022-03-23 MED ORDER — ALBUTEROL SULFATE HFA 108 (90 BASE) MCG/ACT IN AERS: 1.0000 | INHALATION_SPRAY | Freq: Four times a day (QID) | RESPIRATORY_TRACT | 1 refills | Status: DC | PRN

## 2022-03-24 ENCOUNTER — Telehealth: Payer: Self-pay | Admitting: Physician Assistant

## 2022-03-24 DIAGNOSIS — J329 Chronic sinusitis, unspecified: Secondary | ICD-10-CM

## 2022-03-24 NOTE — Telephone Encounter (Signed)
Pt states she was instructed to let PCP know the name of an ENT-"Sinus doctor"    Shanon Brow L. Wilburn Cornelia, MD Specialties and/or [address] [place], [place] [id number]

## 2022-03-24 NOTE — Telephone Encounter (Signed)
See message from pt

## 2022-03-25 NOTE — Addendum Note (Signed)
Addended by: Marian Sorrow on: 03/25/2022 12:05 PM   Modules accepted: Orders

## 2022-03-25 NOTE — Telephone Encounter (Signed)
Spoke to pt told her calling about her message for ENT provider she sent. We placed a referral for you at last visit, but do you want to see the provider you sent [place]? Pt said yes, Told her okay I will send referral for ENT to requested provider. Pt verbalized understanding. Referral placed in Epic.

## 2022-04-27 NOTE — Progress Notes (Signed)
Final PREP class date 04/22/22 Attended 4 of 24 classes Did not return for final measurements

## 2022-05-13 ENCOUNTER — Other Ambulatory Visit: Payer: Self-pay | Admitting: *Deleted

## 2022-05-13 MED ORDER — ATORVASTATIN CALCIUM 20 MG PO TABS: 20.0000 mg | ORAL_TABLET | Freq: Every day | ORAL | 1 refills | Status: DC

## 2022-05-22 ENCOUNTER — Other Ambulatory Visit: Payer: Self-pay

## 2022-05-22 DIAGNOSIS — J310 Chronic rhinitis: Secondary | ICD-10-CM

## 2022-05-22 MED ORDER — LEVOCETIRIZINE DIHYDROCHLORIDE 5 MG PO TABS: 5.0000 mg | ORAL_TABLET | Freq: Every day | ORAL | 5 refills | Status: DC

## 2022-05-25 ENCOUNTER — Encounter: Payer: Self-pay | Admitting: Physician Assistant

## 2022-05-25 ENCOUNTER — Ambulatory Visit (INDEPENDENT_AMBULATORY_CARE_PROVIDER_SITE_OTHER): Payer: Medicaid Other | Admitting: Physician Assistant

## 2022-05-25 VITALS — BP 102/70 | HR 82 | Temp 97.3°F | Ht 65.0 in | Wt 184.2 lb

## 2022-05-25 DIAGNOSIS — F411 Generalized anxiety disorder: Secondary | ICD-10-CM | POA: Diagnosis not present

## 2022-05-25 DIAGNOSIS — E669 Obesity, unspecified: Secondary | ICD-10-CM

## 2022-05-25 MED ORDER — HYDROXYZINE HCL 25 MG PO TABS: ORAL_TABLET | ORAL | 1 refills | Status: DC

## 2022-05-25 MED ORDER — LORAZEPAM 0.5 MG PO TABS: 0.5000 mg | ORAL_TABLET | Freq: Every day | ORAL | 2 refills | Status: DC | PRN
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
## 2022-05-25 NOTE — Patient Instructions (Signed)
It was great to see you!  Start 1/2 tablet of hydroxyzine in the morning to see if this can help with your anxiety May continue the ativan as you are doing  If needed you can take an additional 1/2 tablet at lunchtime if needed  Take care,  [name] PA-C

## 2022-06-05 ENCOUNTER — Ambulatory Visit (INDEPENDENT_AMBULATORY_CARE_PROVIDER_SITE_OTHER): Payer: Medicaid Other | Admitting: Physician Assistant

## 2022-06-05 ENCOUNTER — Encounter: Payer: Self-pay | Admitting: Physician Assistant

## 2022-06-05 VITALS — BP 122/80 | HR 113 | Temp 98.7°F | Ht 65.0 in | Wt 181.0 lb

## 2022-06-05 DIAGNOSIS — E785 Hyperlipidemia, unspecified: Secondary | ICD-10-CM | POA: Diagnosis not present

## 2022-06-05 DIAGNOSIS — J4540 Moderate persistent asthma, uncomplicated: Secondary | ICD-10-CM

## 2022-06-05 DIAGNOSIS — U071 COVID-19: Secondary | ICD-10-CM

## 2022-06-05 LAB — POC COVID19 BINAXNOW: SARS Coronavirus 2 Ag: POSITIVE — AB

## 2022-06-05 MED ORDER — PREDNISONE 20 MG PO TABS: 40.0000 mg | ORAL_TABLET | Freq: Every day | ORAL | 0 refills | Status: DC

## 2022-06-05 MED ORDER — NIRMATRELVIR/RITONAVIR (PAXLOVID)TABLET: 3.0000 | ORAL_TABLET | Freq: Two times a day (BID) | ORAL | 0 refills | Status: AC
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
## 2022-06-05 NOTE — Patient Instructions (Signed)
It was great to see you!  Start paxlovid  HOLD YOUR LIPITOR WHILE YOU ARE ON THIS -- may restart one week after you stop the paxlovid  Because of your asthma, we are also starting prednisone (steroid)  - Please watch closely for new onset shortness of breath, worsening shortness of breath, dizziness, confusion or any worsening symptoms. If any of these occur, please contact us during business hours, and if after business hours, please seek urgent care or go to the closest emergency room.  -If you test positive for COVID, everyone, regardless of vaccination status, should stay home for 5 days since symptom onset (or if asymptomatic, on day of positive test.) If you have no symptoms or your symptoms are resolving after 5 days, you can leave your house. Continue to wear a mask around others for 5 additional days. If you have a fever, continue to stay home until your fever resolves without use of medication.  -Please inform any contacts of your positive result so they can appropriately quarantine/test.  -Push fluids and try to eat small, frequent meals with protein to maintain your stamina.

## 2022-06-17 ENCOUNTER — Telehealth: Payer: Self-pay | Admitting: Physician Assistant

## 2022-06-17 NOTE — Telephone Encounter (Signed)
Please see message and advise what strength of Mucinex to take?

## 2022-06-17 NOTE — Telephone Encounter (Signed)
Spoke to pt told her per [facility], Regular mucinex (guaifenesin) is fine -- does not need special products any with additional ingredients. Look for 1200 mg twice daily (extended release), Generic brand is fine. Pt verbalized understanding.

## 2022-06-17 NOTE — Telephone Encounter (Signed)
Pt states: -she has a cold -would like to take Mucinex but does not know which kind is appropriate for her.  Pt requests: -call back with clarification  Pt declined MyChart message

## 2022-06-24 ENCOUNTER — Ambulatory Visit: Payer: Medicaid Other | Admitting: Physician Assistant

## 2022-06-24 ENCOUNTER — Encounter: Payer: Self-pay | Admitting: Physician Assistant

## 2022-06-24 VITALS — BP 110/62 | HR 78 | Temp 98.4°F | Ht 65.0 in | Wt 180.0 lb

## 2022-06-24 DIAGNOSIS — H6121 Impacted cerumen, right ear: Secondary | ICD-10-CM

## 2022-06-24 DIAGNOSIS — Z23 Encounter for immunization: Secondary | ICD-10-CM
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
## 2022-06-24 NOTE — Patient Instructions (Addendum)
It was great to see you!  Flu shot today  I'm glad you are feeling better  May use cotton ball soaked in mineral oil into ear 10-20 min per week if having recurrent ear wax accumulation. May use dilute hydrogen peroxide (equal parts this and water) and place a few drops into ear every 2 weeks to help break down wax buildup.  Consider Debrox to keep ear wax from building up

## 2022-07-17 ENCOUNTER — Other Ambulatory Visit: Payer: Self-pay | Admitting: Physician Assistant

## 2022-07-23 ENCOUNTER — Other Ambulatory Visit: Payer: Self-pay | Admitting: Physician Assistant

## 2022-07-23 DIAGNOSIS — E663 Overweight: Secondary | ICD-10-CM

## 2022-08-04 ENCOUNTER — Ambulatory Visit: Payer: Medicaid Other | Admitting: Family Medicine

## 2022-08-05 ENCOUNTER — Encounter: Payer: Self-pay | Admitting: Family Medicine

## 2022-08-05 ENCOUNTER — Ambulatory Visit (INDEPENDENT_AMBULATORY_CARE_PROVIDER_SITE_OTHER): Payer: Medicaid Other | Admitting: Family Medicine

## 2022-08-05 VITALS — BP 122/80 | HR 99 | Temp 98.2°F | Ht 65.0 in | Wt 181.1 lb

## 2022-08-05 DIAGNOSIS — J0101 Acute recurrent maxillary sinusitis: Secondary | ICD-10-CM | POA: Diagnosis not present

## 2022-08-05 MED ORDER — AMOXICILLIN-POT CLAVULANATE 875-125 MG PO TABS: 1.0000 | ORAL_TABLET | Freq: Two times a day (BID) | ORAL | 0 refills | Status: DC

## 2022-08-05 NOTE — Patient Instructions (Signed)
Antibiotics sent to pharmacy.  Worse, no change, let us know.
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
## 2022-08-13 ENCOUNTER — Telehealth: Payer: Self-pay | Admitting: Physician Assistant

## 2022-08-13 NOTE — Telephone Encounter (Signed)
Patient states hydrOXYzine (ATARAX) 25 MG tablet is not helping and is making her symptoms worse.  Patient requests to be advised.

## 2022-08-14 ENCOUNTER — Encounter: Payer: Self-pay | Admitting: Physician Assistant

## 2022-08-14 ENCOUNTER — Ambulatory Visit: Payer: Medicaid Other | Admitting: Physician Assistant

## 2022-08-14 VITALS — BP 130/79 | HR 101 | Temp 97.1°F | Ht 65.0 in | Wt 176.8 lb

## 2022-08-14 DIAGNOSIS — F411 Generalized anxiety disorder: Secondary | ICD-10-CM

## 2022-08-14 MED ORDER — GABAPENTIN 100 MG PO CAPS: 100.0000 mg | ORAL_CAPSULE | Freq: Every day | ORAL | 0 refills | Status: DC | PRN
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
## 2022-08-14 NOTE — Patient Instructions (Addendum)
It was great to see you!  Stop hydroxyzine  Start 100 mg gabapentin as needed for your anxiety  Message me in 1 week to let me know if this medication is working for you  Take care,  [name] PA-C

## 2022-08-19 ENCOUNTER — Encounter: Payer: Self-pay | Admitting: Physician Assistant

## 2022-08-25 ENCOUNTER — Other Ambulatory Visit: Payer: Self-pay | Admitting: Physician Assistant

## 2022-08-25 ENCOUNTER — Encounter: Payer: Self-pay | Admitting: Physician Assistant

## 2022-08-25 ENCOUNTER — Ambulatory Visit (INDEPENDENT_AMBULATORY_CARE_PROVIDER_SITE_OTHER): Payer: Medicaid Other | Admitting: Physician Assistant

## 2022-08-25 VITALS — BP 100/60 | HR 80 | Temp 97.3°F | Ht 65.0 in | Wt 179.2 lb

## 2022-08-25 DIAGNOSIS — Z1231 Encounter for screening mammogram for malignant neoplasm of breast: Secondary | ICD-10-CM

## 2022-08-25 DIAGNOSIS — R29898 Other symptoms and signs involving the musculoskeletal system: Secondary | ICD-10-CM

## 2022-08-25 DIAGNOSIS — E663 Overweight: Secondary | ICD-10-CM

## 2022-08-25 DIAGNOSIS — Z0000 Encounter for general adult medical examination without abnormal findings: Secondary | ICD-10-CM

## 2022-08-25 DIAGNOSIS — R2689 Other abnormalities of gait and mobility: Secondary | ICD-10-CM

## 2022-08-25 DIAGNOSIS — Z114 Encounter for screening for human immunodeficiency virus [HIV]: Secondary | ICD-10-CM

## 2022-08-25 DIAGNOSIS — F411 Generalized anxiety disorder: Secondary | ICD-10-CM

## 2022-08-25 DIAGNOSIS — I639 Cerebral infarction, unspecified: Secondary | ICD-10-CM | POA: Diagnosis not present

## 2022-08-25 DIAGNOSIS — Z1159 Encounter for screening for other viral diseases: Secondary | ICD-10-CM

## 2022-08-25 LAB — LIPID PANEL
Cholesterol: 153 mg/dL (ref 0–200)
HDL: 55.4 mg/dL (ref >39.00)
LDL Cholesterol: 81 mg/dL (ref 0–99)
NonHDL: 97.44
Total CHOL/HDL Ratio: 3
Triglycerides: 82 mg/dL (ref 0.0–149.0)
VLDL: 16.4 mg/dL (ref 0.0–40.0)

## 2022-08-25 LAB — CBC WITH DIFFERENTIAL/PLATELET
Basophils Absolute: 0 10*3/uL (ref 0.0–0.1)
Basophils Relative: 0.7 % (ref 0.0–3.0)
Eosinophils Absolute: 0.1 10*3/uL (ref 0.0–0.7)
Eosinophils Relative: 2.8 % (ref 0.0–5.0)
HCT: 37.6 % (ref 36.0–46.0)
Hemoglobin: 12.4 g/dL (ref 12.0–15.0)
Lymphocytes Relative: 51.7 % — ABNORMAL HIGH (ref 12.0–46.0)
Lymphs Abs: 2.2 10*3/uL (ref 0.7–4.0)
MCHC: 33.1 g/dL (ref 30.0–36.0)
MCV: 84 fl (ref 78.0–100.0)
Monocytes Absolute: 0.5 10*3/uL (ref 0.1–1.0)
Monocytes Relative: 11.4 % (ref 3.0–12.0)
Neutro Abs: 1.4 10*3/uL (ref 1.4–7.7)
Neutrophils Relative %: 33.4 % — ABNORMAL LOW (ref 43.0–77.0)
Platelets: 238 10*3/uL (ref 150.0–400.0)
RBC: 4.48 Mil/uL (ref 3.87–5.11)
RDW: 16.3 % — ABNORMAL HIGH (ref 11.5–15.5)
WBC: 4.2 10*3/uL (ref 4.0–10.5)

## 2022-08-25 LAB — HEMOGLOBIN A1C: Hgb A1c MFr Bld: 5.9 % (ref 4.6–6.5)

## 2022-08-25 LAB — COMPREHENSIVE METABOLIC PANEL
ALT: 14 U/L (ref 0–35)
AST: 14 U/L (ref 0–37)
Albumin: 4.3 g/dL (ref 3.5–5.2)
Alkaline Phosphatase: 86 U/L (ref 39–117)
BUN: 14 mg/dL (ref 6–23)
CO2: 30 mEq/L (ref 19–32)
Calcium: 9.3 mg/dL (ref 8.4–10.5)
Chloride: 104 mEq/L (ref 96–112)
Creatinine, Ser: 0.82 mg/dL (ref 0.40–1.20)
GFR: 81.46 mL/min (ref >60.00)
Glucose, Bld: 91 mg/dL (ref 70–99)
Potassium: 4.4 mEq/L (ref 3.5–5.1)
Sodium: 138 mEq/L (ref 135–145)
Total Bilirubin: 0.3 mg/dL (ref 0.2–1.2)
Total Protein: 6.7 g/dL (ref 6.0–8.3)
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
## 2022-08-25 NOTE — Patient Instructions (Addendum)
It was great to see you!  Please call and schedule your mammogram  I will place referral for PT at the below location, if they take your insurance    Please go to the lab for blood work.   Our office will call you with your results unless you have chosen to receive results via MyChart.  If your blood work is normal we will follow-up each year for physicals and as scheduled for chronic medical problems.  If anything is abnormal we will treat accordingly and get you in for a follow-up.  Take care,  Aldona Bar

## 2022-08-26 ENCOUNTER — Other Ambulatory Visit: Payer: Self-pay | Admitting: Physician Assistant

## 2022-08-26 LAB — HEPATITIS C ANTIBODY: Hepatitis C Ab: NONREACTIVE

## 2022-08-26 LAB — HIV ANTIBODY (ROUTINE TESTING W REFLEX): HIV 1&2 Ab, 4th Generation: NONREACTIVE

## 2022-08-26 MED ORDER — ATORVASTATIN CALCIUM 40 MG PO TABS: 40.0000 mg | ORAL_TABLET | Freq: Every day | ORAL | 3 refills | Status: DC

## 2022-09-03 ENCOUNTER — Encounter: Payer: Self-pay | Admitting: Physician Assistant

## 2022-09-04 ENCOUNTER — Other Ambulatory Visit: Payer: Self-pay | Admitting: Physician Assistant

## 2022-09-04 DIAGNOSIS — E663 Overweight: Secondary | ICD-10-CM

## 2022-09-06 ENCOUNTER — Encounter: Payer: Self-pay | Admitting: Physician Assistant

## 2022-09-08 ENCOUNTER — Other Ambulatory Visit: Payer: Self-pay | Admitting: Physician Assistant

## 2022-09-08 NOTE — Telephone Encounter (Signed)
Pt requesting refill for Gabapentin & Albuterol Inhaler. Last OV 11/20221/2023.

## 2022-09-12 ENCOUNTER — Other Ambulatory Visit: Payer: Self-pay | Admitting: Physician Assistant

## 2022-09-12 DIAGNOSIS — E663 Overweight: Secondary | ICD-10-CM

## 2022-09-14 ENCOUNTER — Encounter: Payer: Self-pay | Admitting: Physician Assistant

## 2022-09-14 ENCOUNTER — Other Ambulatory Visit: Payer: Self-pay | Admitting: Physician Assistant

## 2022-09-14 MED ORDER — PHENTERMINE HCL 37.5 MG PO CAPS: 37.5000 mg | ORAL_CAPSULE | ORAL | 1 refills | Status: DC

## 2022-09-18 ENCOUNTER — Other Ambulatory Visit: Payer: Self-pay | Admitting: Physician Assistant

## 2022-09-18 NOTE — Telephone Encounter (Signed)
Patient called to make sure refill request was placed. Patient requests this medication be sent to [facility] on [address] as soon as PCP is able.

## 2022-09-19 ENCOUNTER — Other Ambulatory Visit: Payer: Self-pay | Admitting: Physician Assistant

## 2022-09-20 ENCOUNTER — Encounter: Payer: Self-pay | Admitting: Physician Assistant
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
## 2022-09-24 ENCOUNTER — Ambulatory Visit: Payer: Medicaid Other | Admitting: Occupational Therapy

## 2022-09-24 ENCOUNTER — Ambulatory Visit: Payer: Medicaid Other | Admitting: Physical Therapy

## 2022-10-01 ENCOUNTER — Ambulatory Visit: Payer: Medicaid Other | Admitting: Physical Therapy

## 2022-10-01 ENCOUNTER — Ambulatory Visit: Payer: Medicaid Other | Attending: Physician Assistant | Admitting: Occupational Therapy

## 2022-10-01 ENCOUNTER — Encounter: Payer: Self-pay | Admitting: Occupational Therapy

## 2022-10-01 DIAGNOSIS — M6281 Muscle weakness (generalized): Secondary | ICD-10-CM | POA: Insufficient documentation

## 2022-10-01 DIAGNOSIS — I69354 Hemiplegia and hemiparesis following cerebral infarction affecting left non-dominant side: Secondary | ICD-10-CM | POA: Diagnosis present

## 2022-10-01 DIAGNOSIS — R2689 Other abnormalities of gait and mobility: Secondary | ICD-10-CM | POA: Diagnosis present

## 2022-10-01 DIAGNOSIS — I69915 Cognitive social or emotional deficit following unspecified cerebrovascular disease: Secondary | ICD-10-CM | POA: Diagnosis present
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
## 2022-10-02 ENCOUNTER — Ambulatory Visit: Payer: Medicaid Other | Admitting: Physician Assistant

## 2022-10-02 ENCOUNTER — Encounter: Payer: Self-pay | Admitting: Physician Assistant

## 2022-10-02 VITALS — BP 116/53 | HR 87 | Temp 98.0°F | Ht 65.0 in | Wt 174.6 lb

## 2022-10-02 DIAGNOSIS — F32A Depression, unspecified: Secondary | ICD-10-CM | POA: Diagnosis not present

## 2022-10-02 DIAGNOSIS — I639 Cerebral infarction, unspecified: Secondary | ICD-10-CM

## 2022-10-02 DIAGNOSIS — J329 Chronic sinusitis, unspecified: Secondary | ICD-10-CM | POA: Diagnosis not present

## 2022-10-02 DIAGNOSIS — F419 Anxiety disorder, unspecified: Secondary | ICD-10-CM

## 2022-10-02 MED ORDER — ASPIRIN 81 MG PO TBEC: 81.0000 mg | DELAYED_RELEASE_TABLET | Freq: Every day | ORAL | 1 refills | Status: DC

## 2022-10-02 MED ORDER — GABAPENTIN 100 MG PO CAPS: 100.0000 mg | ORAL_CAPSULE | Freq: Two times a day (BID) | ORAL | 1 refills | Status: DC

## 2022-10-02 MED ORDER — DOXYCYCLINE HYCLATE 100 MG PO TABS: 100.0000 mg | ORAL_TABLET | Freq: Two times a day (BID) | ORAL | 0 refills | Status: DC

## 2022-10-02 NOTE — Patient Instructions (Signed)
It was great to see you!  Start doxycycline antibiotic for your sinus infection  Start daily aspirin to prevent stroke -- I have sent this in to your pharmacy. If this is not covered by your insurance, please ask the pharmacist to show you were it is over the counter.  Increase your gabapentin to 100 mg in AM and PM.  Let's follow-up in 1 month to talk about your mood, sooner if you have concerns.   Take care,  [name] PA-C
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
## 2022-10-13 ENCOUNTER — Other Ambulatory Visit: Payer: Self-pay | Admitting: Physician Assistant

## 2022-10-20 ENCOUNTER — Other Ambulatory Visit: Payer: Self-pay | Admitting: Physician Assistant

## 2022-10-21 ENCOUNTER — Ambulatory Visit
Admission: RE | Admit: 2022-10-21 | Discharge: 2022-10-21 | Disposition: A | Discharge status: Home / Self Care | Payer: Medicaid Other | Source: Ambulatory Visit | Attending: Physician Assistant | Admitting: Physician Assistant

## 2022-10-21 DIAGNOSIS — Z1231 Encounter for screening mammogram for malignant neoplasm of breast: Secondary | ICD-10-CM

## 2022-10-23 ENCOUNTER — Ambulatory Visit: Payer: Medicaid Other | Admitting: Occupational Therapy

## 2022-10-30 ENCOUNTER — Ambulatory Visit (INDEPENDENT_AMBULATORY_CARE_PROVIDER_SITE_OTHER)
Admission: RE | Admit: 2022-10-30 | Discharge: 2022-10-30 | Disposition: A | Discharge status: Home / Self Care | Payer: Medicaid Other | Source: Ambulatory Visit | Attending: Internal Medicine | Admitting: Internal Medicine

## 2022-10-30 ENCOUNTER — Encounter: Payer: Self-pay | Admitting: Internal Medicine

## 2022-10-30 ENCOUNTER — Other Ambulatory Visit: Payer: Self-pay | Admitting: Internal Medicine

## 2022-10-30 ENCOUNTER — Ambulatory Visit: Payer: Medicaid Other | Admitting: Internal Medicine

## 2022-10-30 VITALS — BP 111/66 | HR 97 | Temp 98.0°F | Ht 65.0 in | Wt 175.2 lb

## 2022-10-30 DIAGNOSIS — D489 Neoplasm of uncertain behavior, unspecified: Secondary | ICD-10-CM | POA: Diagnosis not present

## 2022-10-30 DIAGNOSIS — M79671 Pain in right foot: Secondary | ICD-10-CM

## 2022-10-30 LAB — URIC ACID: Uric Acid, Serum: 5.1 mg/dL (ref 2.4–7.0)

## 2022-10-30 LAB — COMPREHENSIVE METABOLIC PANEL
ALT: 17 U/L (ref 0–35)
AST: 16 U/L (ref 0–37)
Albumin: 4.4 g/dL (ref 3.5–5.2)
Alkaline Phosphatase: 103 U/L (ref 39–117)
BUN: 11 mg/dL (ref 6–23)
CO2: 31 mEq/L (ref 19–32)
Calcium: 9.5 mg/dL (ref 8.4–10.5)
Chloride: 103 mEq/L (ref 96–112)
Creatinine, Ser: 0.78 mg/dL (ref 0.40–1.20)
GFR: 86.39 mL/min (ref >60.00)
Glucose, Bld: 96 mg/dL (ref 70–99)
Potassium: 4.3 mEq/L (ref 3.5–5.1)
Sodium: 141 mEq/L (ref 135–145)
Total Bilirubin: 0.4 mg/dL (ref 0.2–1.2)
Total Protein: 7 g/dL (ref 6.0–8.3)

## 2022-10-30 LAB — C-REACTIVE PROTEIN: CRP: <1 mg/dL (ref 0.5–20.0)

## 2022-10-30 LAB — CBC
HCT: 37.7 % (ref 36.0–46.0)
Hemoglobin: 12.6 g/dL (ref 12.0–15.0)
MCHC: 33.4 g/dL (ref 30.0–36.0)
MCV: 84 fl (ref 78.0–100.0)
Platelets: 248 10*3/uL (ref 150.0–400.0)
RBC: 4.49 Mil/uL (ref 3.87–5.11)
RDW: 16 % — ABNORMAL HIGH (ref 11.5–15.5)
WBC: 4.8 10*3/uL (ref 4.0–10.5)

## 2022-10-30 LAB — SEDIMENTATION RATE: Sed Rate: 13 mm/hr (ref 0–30)
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
## 2022-10-30 NOTE — Patient Instructions (Addendum)
It was a pleasure seeing you today!  Your health and satisfaction are my top priorities. If you believe your experience today was worthy of a 5-star rating, I'd be grateful for your feedback! Loralee Pacas, MD   CHECKOUT CHECKLIST  '[]'$    Schedule next appointment(s):    Return in about 1 week (around 11/06/2022).  Any requested lab visits should be scheduled as appointments too  If you are not doing well:  Return to the office sooner Please bring all your medicine bottles to each appointment If your condition begins to worsen or become severe:  go to the emergency room or even call 911  REMINDERS:  '[]'$   Maybe mri next week depending on labs and xray results, no antibiotic(s) until we rule out bone infection   '[]'$    X-rays can be obtained at the  [facility] office. You can walk in M-F between 8:30am- noon or 1pm - 5pm. Tell them you are there for xrays ordered by me. They will send me the results, then I will let you know the results with instructions. Address: [address].  The Xray department is located in the basement.     '[]'$   (Optional):  Review your clinical notes on MyChart after they are completed.     Today's draft of the physician documented plan for today's visit: (final revisions will be visible on MyChart chart later)   Foot pain, right -     Sedimentation rate -     C-reactive protein -     DG Foot 2 Views Left; Future -     CBC -     Comprehensive metabolic panel -     Uric acid -     Cyclic citrul peptide antibody, IgG -     Ambulatory referral to Podiatry  Neoplasm, uncertain whether benign or malignant -     Ambulatory referral to Dermatology      QUESTIONS & CONCERNS: CLINICAL: please contact us via phone [phone] OR MyChart messaging  LAB & IMAGING:   We will call you if the results are significantly abnormal or you don't use MyChart.  Most normal results will be posted to MyChart immediately and have a clinical review message by Dr.  Randol Kern posted within 2-3 business days.   If you have not heard from [place] regarding the results in 2 weeks OR if you need priority reporting, please contact this office. MYCHART:  The fastest way to get your results and easiest way to stay in touch with [place] is by activating your My Chart account. Instructions are located on the last page of this paperwork.  BILLING: xray and lab orders are billed from separate companies and questions./concerns should be directed to the [facility].  For visit charges please discuss with our administrative services COMPLAINTS:  please let Dr. Randol Kern know or see the [facility], by asking at the front desk: we want you to be satisfied with every experience and we would be grateful for the opportunity to address any problems

## 2022-11-02 LAB — CYCLIC CITRUL PEPTIDE ANTIBODY, IGG: Cyclic Citrullin Peptide Ab: <16 UNITS

## 2022-11-03 ENCOUNTER — Encounter: Payer: Self-pay | Admitting: Internal Medicine

## 2022-11-05 ENCOUNTER — Encounter: Payer: Self-pay | Admitting: Internal Medicine

## 2022-11-05 ENCOUNTER — Ambulatory Visit: Payer: Medicaid Other | Admitting: Internal Medicine

## 2022-11-05 VITALS — BP 111/70 | HR 87 | Temp 98.0°F | Ht 65.0 in | Wt 175.2 lb

## 2022-11-05 DIAGNOSIS — L089 Local infection of the skin and subcutaneous tissue, unspecified: Secondary | ICD-10-CM | POA: Diagnosis not present

## 2022-11-05 MED ORDER — IBUPROFEN 600 MG PO TABS: 600.0000 mg | ORAL_TABLET | Freq: Four times a day (QID) | ORAL | 0 refills | Status: DC | PRN
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
## 2022-11-05 NOTE — Addendum Note (Signed)
Addended by: Loralee Pacas on: 11/05/2022 12:32 PM   Modules accepted: Orders
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
## 2022-11-06 ENCOUNTER — Telehealth: Payer: Self-pay | Admitting: Occupational Therapy

## 2022-11-06 ENCOUNTER — Ambulatory Visit: Payer: Medicaid Other | Attending: Physician Assistant | Admitting: Occupational Therapy

## 2022-11-06 NOTE — Telephone Encounter (Signed)
Pt made aware of missed visits (1 cancel less than 24 hours prior to appointment and 1 no show) and upcoming appointment on 2/16. Therapist requested call back to confirm she is coming to her next scheduled appointment or if she would like to d/c.

## 2022-11-12 ENCOUNTER — Ambulatory Visit: Payer: Medicaid Other | Admitting: Physician Assistant

## 2022-11-12 ENCOUNTER — Encounter: Payer: Self-pay | Admitting: Physician Assistant

## 2022-11-12 VITALS — BP 100/70 | HR 82 | Temp 97.7°F | Ht 65.0 in | Wt 176.2 lb

## 2022-11-12 DIAGNOSIS — F32A Depression, unspecified: Secondary | ICD-10-CM

## 2022-11-12 DIAGNOSIS — F419 Anxiety disorder, unspecified: Secondary | ICD-10-CM | POA: Diagnosis not present

## 2022-11-12 DIAGNOSIS — D489 Neoplasm of uncertain behavior, unspecified: Secondary | ICD-10-CM | POA: Diagnosis not present

## 2022-11-12 DIAGNOSIS — M79671 Pain in right foot: Secondary | ICD-10-CM | POA: Diagnosis not present
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
## 2022-11-12 NOTE — Patient Instructions (Signed)
It was great to see you!  Go see Dr Jacqualyn Posey tomorrow to take over your evaluation of your foot. We are not going to do an MRI through our office. If he would like to pursue an MRI, please let him order this.  We will cancel your dermatology referral.  Keep up the good work with your anxiety!  Follow-up in 3-6 months.  Take care,  [name] PA-C

## 2022-11-13 ENCOUNTER — Ambulatory Visit (INDEPENDENT_AMBULATORY_CARE_PROVIDER_SITE_OTHER): Payer: Medicaid Other

## 2022-11-13 ENCOUNTER — Ambulatory Visit: Payer: Medicaid Other

## 2022-11-13 ENCOUNTER — Ambulatory Visit: Payer: Medicaid Other | Admitting: Podiatry

## 2022-11-13 ENCOUNTER — Encounter: Payer: Self-pay | Admitting: Podiatry

## 2022-11-13 DIAGNOSIS — R2241 Localized swelling, mass and lump, right lower limb: Secondary | ICD-10-CM | POA: Diagnosis not present

## 2022-11-13 DIAGNOSIS — M7751 Other enthesopathy of right foot: Secondary | ICD-10-CM | POA: Diagnosis not present

## 2022-11-13 DIAGNOSIS — M7750 Other enthesopathy of unspecified foot: Secondary | ICD-10-CM | POA: Diagnosis not present

## 2022-11-13 MED ORDER — METHYLPREDNISOLONE 4 MG PO TBPK: ORAL_TABLET | ORAL | 0 refills | Status: DC

## 2022-11-13 NOTE — Progress Notes (Unsigned)
Subjective:   Patient ID: Christine Cunningham, female   DOB: 52 y.o.   MRN: [id number]   HPI Chief Complaint  Patient presents with   Foot Pain    (New Patient) Diagnosis: M79.671 (ICD-10-CM) - Foot pain, right Referring Provider: Berniece Pap G      Pain to submet 2 nd going into the 2nd toe. No treatment. No recent injuries she said at first. Then she reports a cut to the area previously. No numbness or tingling. She gets sharp pains into the 2nd toe. No swelling that she has noticed.    ROS      Objective:  Physical Exam  *** Birth right sub 1     Assessment:  ***     Plan:  ***

## 2022-11-16 ENCOUNTER — Encounter: Payer: Self-pay | Admitting: Internal Medicine

## 2022-11-16 DIAGNOSIS — M79673 Pain in unspecified foot: Secondary | ICD-10-CM | POA: Insufficient documentation

## 2022-11-19 ENCOUNTER — Ambulatory Visit: Payer: Medicaid Other | Admitting: Internal Medicine

## 2022-11-19 ENCOUNTER — Other Ambulatory Visit: Payer: Self-pay | Admitting: Podiatry

## 2022-11-19 ENCOUNTER — Telehealth: Payer: Self-pay | Admitting: *Deleted

## 2022-11-19 ENCOUNTER — Encounter: Payer: Self-pay | Admitting: Occupational Therapy

## 2022-11-19 DIAGNOSIS — M7741 Metatarsalgia, right foot: Secondary | ICD-10-CM

## 2022-11-19 NOTE — Therapy (Signed)
[facility] [address] [place], [place], [address] Phone: [phone]   Fax:  [phone]  November 19, 2022    Occupational Therapy Discharge Summary   Patient: Christine Cunningham MRN: [id number] Date of Birth: 06-06-1971  Pt returned clinic phone call and requests d/c. Unable to assess progression towards goals as pt was not seen in therapy clinic.   Dennis Bast, OTR/L 11/19/2022, 10:13 AM

## 2022-11-19 NOTE — Telephone Encounter (Signed)
Patient is calling to check on an order for an MRI that was supposed to be scheduled, explained that the referral was placed by Dr Randol Kern, then cancelled, not sure why. It was mentioned in last office notes by our office but no order placed, please advise.

## 2022-11-19 NOTE — Telephone Encounter (Signed)
Patient came into the office to see Dr. Jacqualyn Posey on 11/13/2022. Dr. Randol Kern had set up the MRI but ended up canceling because she was referred over to see Dr. Jacqualyn Posey. Patient would like to see if Dr. Jacqualyn Posey can send over an order for her right foot to get an MRI since she is still in pain.  Please advise

## 2022-11-20 ENCOUNTER — Ambulatory Visit: Payer: Medicaid Other | Admitting: Occupational Therapy

## 2022-11-20 ENCOUNTER — Telehealth: Payer: Self-pay | Admitting: *Deleted

## 2022-11-20 NOTE — Telephone Encounter (Signed)
Called patient and left voice message that MRI has been placed with [facility],was given # to call them for scheduling.

## 2022-11-23 ENCOUNTER — Telehealth: Payer: Self-pay | Admitting: *Deleted

## 2022-11-23 NOTE — Telephone Encounter (Signed)
Patient is calling for status of MRI, called [facility],no answer, will call back tomorrow for scheduling.

## 2022-12-04 ENCOUNTER — Encounter: Payer: Medicaid Other | Admitting: Occupational Therapy

## 2022-12-10 ENCOUNTER — Other Ambulatory Visit: Payer: Self-pay | Admitting: Physician Assistant

## 2022-12-10 ENCOUNTER — Inpatient Hospital Stay: Admission: RE | Admit: 2022-12-10 | Payer: Medicaid Other | Source: Ambulatory Visit

## 2022-12-10 ENCOUNTER — Ambulatory Visit
Admission: RE | Admit: 2022-12-10 | Discharge: 2022-12-10 | Disposition: A | Discharge status: Home / Self Care | Payer: Medicaid Other | Source: Ambulatory Visit | Attending: Podiatry | Admitting: Podiatry

## 2022-12-10 DIAGNOSIS — M7741 Metatarsalgia, right foot: Secondary | ICD-10-CM

## 2022-12-10 NOTE — Telephone Encounter (Signed)
Pt requesting refill for Gabapentin 100 mg BID. Last OV 11/12/2022.

## 2022-12-11 ENCOUNTER — Encounter: Payer: Self-pay | Admitting: Physician Assistant

## 2022-12-14 ENCOUNTER — Ambulatory Visit (INDEPENDENT_AMBULATORY_CARE_PROVIDER_SITE_OTHER): Payer: Medicaid Other | Admitting: Podiatry

## 2022-12-14 DIAGNOSIS — G5761 Lesion of plantar nerve, right lower limb: Secondary | ICD-10-CM

## 2022-12-14 MED ORDER — TRIAMCINOLONE ACETONIDE 10 MG/ML IJ SUSP
10.0000 mg | Freq: Once | INTRAMUSCULAR | Status: AC
  Administered 2022-12-14: 10 mg

## 2022-12-14 NOTE — Progress Notes (Signed)
Subjective: Chief Complaint  Patient presents with   Follow-up    RIGHT FOOT, PATIENT FOOT IS ABOUT THE SAME AS BEFORE, ACHING AND NUMBNESS IN THE TOES AND BALL OF FOOT   52 year old female presents the office with above concerns.  She states that she has developed the same as she was previously.  She presents to discuss MRI results as well.  She is asking about a boot or special shoe to wear.  No recent injury or changes.  Objective: AAO x3, NAD DP/PT pulses palpable bilaterally, CRT less than 3 seconds Tenderness palpation also interspace.  Treatment able to palpate a neuroma versus bursa but she is experiencing numbness in her toes and consider more consistent with a neuroma.  Minimal edema.  There is no erythema or warmth. 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  -I reviewed the MRI with her but did not reveal neuroma or fluid collection.  Clinically she has palpable neuroma or first of the second interspace today.  We discussed steroid injection she wishes to proceed with this.  Skin was cleaned with alcohol and mixture 1 cc Kenalog 10, 0.5 cc of Marcaine plain, 0.5 cc of lidocaine plain was infiltrated into the area of maximal tenderness on secondary complications.  Postinjection care discussed.  Follow-up. -CAM boot dispensed for immobilization.  As she starts to improve she can transition into a shoe with good arch support. -Patient encouraged to call the office with any questions, concerns, change in symptoms.    Trula Slade DPM

## 2022-12-14 NOTE — Patient Instructions (Signed)

## 2022-12-19 ENCOUNTER — Other Ambulatory Visit: Payer: Self-pay | Admitting: Physician Assistant

## 2022-12-21 NOTE — Telephone Encounter (Signed)
Pt requesting refill for Lorazepam 0.5 mg. Last OV 11/12/2022.

## 2023-01-06 ENCOUNTER — Other Ambulatory Visit: Payer: Self-pay | Admitting: Physician Assistant

## 2023-01-06 ENCOUNTER — Encounter: Payer: Self-pay | Admitting: Physician Assistant

## 2023-01-11 ENCOUNTER — Ambulatory Visit (INDEPENDENT_AMBULATORY_CARE_PROVIDER_SITE_OTHER): Payer: Medicaid Other | Admitting: Podiatry

## 2023-01-11 DIAGNOSIS — G5761 Lesion of plantar nerve, right lower limb: Secondary | ICD-10-CM | POA: Diagnosis not present

## 2023-01-11 DIAGNOSIS — M7741 Metatarsalgia, right foot: Secondary | ICD-10-CM

## 2023-01-11 MED ORDER — TRIAMCINOLONE ACETONIDE 10 MG/ML IJ SUSP
10.0000 mg | Freq: Once | INTRAMUSCULAR | Status: AC
Start: 2023-01-11 — End: 2023-01-11
  Administered 2023-01-11: 10 mg

## 2023-01-11 NOTE — Progress Notes (Signed)
Subjective: Chief Complaint  Patient presents with   Neuroma    Pt stated that she is doing a little better she started feeling some slight discomfort in her foot last night     52 year old female presents the office with above concerns.  She send injection was helpful with the symptoms started to come back over the last couple of days but not as bad.  No injuries.  No complications from the last injection.  Objective: AAO x3, NAD DP/PT pulses palpable bilaterally, CRT less than 3 seconds Tenderness palpation along the second interspace.  I am able to palpate a small neuroma left second interspace today.  No area pinpoint tenderness.  No edema, erythema. 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  -Clinically able to palpate a neuroma today.  She was proceed with other injections. Skin was cleaned with alcohol and mixture 1 cc Kenalog 10, 0.5 cc of Marcaine plain, 0.5 cc of lidocaine plain was infiltrated into the area of maximal tenderness on secondary complications.  Postinjection care discussed.  Follow-up. -Surgical shoe dispensed immobilization as the cam boot was causing problems.  She can transition to regular shoe as tolerated. -Patient encouraged to call the office with any questions, concerns, change in symptoms.    Vivi Barrack DPM

## 2023-01-11 NOTE — Patient Instructions (Signed)

## 2023-01-12 ENCOUNTER — Encounter: Payer: Self-pay | Admitting: Physician Assistant

## 2023-01-12 ENCOUNTER — Other Ambulatory Visit: Payer: Self-pay | Admitting: Family Medicine

## 2023-01-12 ENCOUNTER — Ambulatory Visit (INDEPENDENT_AMBULATORY_CARE_PROVIDER_SITE_OTHER): Payer: Medicaid Other | Admitting: Physician Assistant

## 2023-01-12 VITALS — BP 102/70 | HR 60 | Temp 97.3°F | Ht 65.0 in | Wt 182.0 lb

## 2023-01-12 DIAGNOSIS — J329 Chronic sinusitis, unspecified: Secondary | ICD-10-CM

## 2023-01-12 DIAGNOSIS — G4700 Insomnia, unspecified: Secondary | ICD-10-CM | POA: Diagnosis not present

## 2023-01-12 DIAGNOSIS — J310 Chronic rhinitis: Secondary | ICD-10-CM

## 2023-01-12 MED ORDER — AMOXICILLIN-POT CLAVULANATE 875-125 MG PO TABS: 1.0000 | ORAL_TABLET | Freq: Two times a day (BID) | ORAL | 0 refills | Status: DC

## 2023-01-12 MED ORDER — MONTELUKAST SODIUM 10 MG PO TABS: 10.0000 mg | ORAL_TABLET | Freq: Every day | ORAL | 1 refills | Status: DC

## 2023-01-12 NOTE — Patient Instructions (Signed)
It was great to see you!  Try to take your gabapentin 100 mg nightly a little earlier to see if this helps  Restart your singulair -- I have sent this in If no improvement in a few days, trial the antibiotic -- this was also sent in   Continue zyrtec  Follow-up in 3 months to review medications  Take care,  Jarold Motto PA-C
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
## 2023-01-15 ENCOUNTER — Other Ambulatory Visit: Payer: Self-pay | Admitting: *Deleted

## 2023-01-15 MED ORDER — MONTELUKAST SODIUM 10 MG PO TABS: 10.0000 mg | ORAL_TABLET | Freq: Every day | ORAL | 1 refills | Status: DC

## 2023-01-22 ENCOUNTER — Other Ambulatory Visit: Payer: Self-pay | Admitting: Family Medicine

## 2023-01-22 DIAGNOSIS — J310 Chronic rhinitis: Secondary | ICD-10-CM

## 2023-01-25 ENCOUNTER — Encounter: Payer: Self-pay | Admitting: Physician Assistant

## 2023-01-25 DIAGNOSIS — J310 Chronic rhinitis: Secondary | ICD-10-CM

## 2023-01-25 MED ORDER — LEVOCETIRIZINE DIHYDROCHLORIDE 5 MG PO TABS
5.0000 mg | ORAL_TABLET | Freq: Every day | ORAL | 5 refills | Status: DC
Start: 2023-01-25 — End: 2023-08-16

## 2023-02-10 ENCOUNTER — Encounter: Payer: Self-pay | Admitting: Physician Assistant

## 2023-02-11 NOTE — Telephone Encounter (Signed)
Patient insurance will not cover Amox-Clav please advise another alternative we can send to pharmacy.

## 2023-02-11 NOTE — Telephone Encounter (Signed)
Please see pt msg and advise if new Rx or alternative needs to be sent in for patient

## 2023-02-12 ENCOUNTER — Other Ambulatory Visit: Payer: Self-pay | Admitting: Family Medicine

## 2023-02-12 MED ORDER — AMOXICILLIN-POT CLAVULANATE 875-125 MG PO TABS: 1.0000 | ORAL_TABLET | Freq: Two times a day (BID) | ORAL | 0 refills | Status: DC

## 2023-02-12 NOTE — Telephone Encounter (Signed)
Patient states per PCP was advised her to tery taking Montelukast first and if not getting better then take Amoxicillin that was sent to pharmacy. When going to pharmacy to have filled/pick up was advised insurance doesn't cover. Please advise

## 2023-02-15 ENCOUNTER — Ambulatory Visit (INDEPENDENT_AMBULATORY_CARE_PROVIDER_SITE_OTHER): Payer: Medicaid Other | Admitting: Podiatry

## 2023-02-15 ENCOUNTER — Encounter: Payer: Self-pay | Admitting: Physician Assistant

## 2023-02-15 ENCOUNTER — Encounter: Payer: Self-pay | Admitting: Podiatry

## 2023-02-15 DIAGNOSIS — G5761 Lesion of plantar nerve, right lower limb: Secondary | ICD-10-CM | POA: Diagnosis not present

## 2023-02-15 MED ORDER — LORAZEPAM 0.5 MG PO TABS: ORAL_TABLET | ORAL | 0 refills | Status: DC

## 2023-02-15 NOTE — Progress Notes (Signed)
Subjective: Chief Complaint  Patient presents with   Foot Pain    Follow up neuroma 2nd IDS right   "It does good with the shot and then right when I'm supposed to come back, it starts hurting again"    52 year old female presents the office with above concerns. She states that she was doing well but her foot started to hurt her more over the last day.  She typically wears more flat shoes and inserts are surgical back in surgical shoe which helps.  Objective: AAO x3, NAD DP/PT pulses palpable bilaterally, CRT less than 3 seconds Tenderness palpation along the second interspace.  I am still able to palpate a small neuroma left second interspace today.  No area pinpoint tenderness.  No edema, erythema. 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  -Clinically able to palpate a neuroma today.  She was proceed with other injections. Skin was cleaned with alcohol and mixture 1 cc Kenalog 10, 0.5 cc of Marcaine plain, 0.5 cc of lidocaine plain was infiltrated into the area of maximal tenderness on secondary complications.  Postinjection care discussed.  Follow-up. -Surgical shoe does help we discussed try to wear shoes with better support to help take pressure off the forefoot. -We discussed other treatment options should she continue to have symptoms such as different types of injections, physical therapy and as a last resort surgery. -Patient encouraged to call the office with any questions, concerns, change in symptoms.    Vivi Barrack DPM

## 2023-02-15 NOTE — Patient Instructions (Signed)
For sandals I like vionic or oofos or any shoe with a good arch support.  For sneakers I like [employer], [employer]. For sneakers I would go to [employer]

## 2023-02-17 ENCOUNTER — Encounter: Payer: Self-pay | Admitting: Physician Assistant

## 2023-02-17 MED ORDER — PHENTERMINE HCL 37.5 MG PO CAPS: 37.5000 mg | ORAL_CAPSULE | ORAL | 1 refills | Status: DC

## 2023-02-18 ENCOUNTER — Other Ambulatory Visit: Payer: Self-pay | Admitting: Physician Assistant

## 2023-03-16 ENCOUNTER — Ambulatory Visit (INDEPENDENT_AMBULATORY_CARE_PROVIDER_SITE_OTHER): Payer: Medicaid Other | Admitting: Physician Assistant

## 2023-03-16 ENCOUNTER — Encounter: Payer: Self-pay | Admitting: Physician Assistant

## 2023-03-16 VITALS — BP 120/80 | HR 86 | Temp 97.7°F | Ht 65.0 in | Wt 177.2 lb

## 2023-03-16 DIAGNOSIS — L989 Disorder of the skin and subcutaneous tissue, unspecified: Secondary | ICD-10-CM
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
## 2023-03-16 NOTE — Patient Instructions (Signed)
It was great to see you!  Use topical Triamcinolone (steroid) in the morning after bath  Before bed apply mupirocin (antibiotic)  Message me if any concerns  Take care,  Jarold Motto PA-C

## 2023-03-29 ENCOUNTER — Ambulatory Visit: Payer: Medicaid Other | Admitting: Podiatry

## 2023-04-13 ENCOUNTER — Encounter: Payer: Self-pay | Admitting: Physician Assistant

## 2023-04-13 ENCOUNTER — Telehealth: Payer: Self-pay | Admitting: *Deleted

## 2023-04-13 ENCOUNTER — Ambulatory Visit (INDEPENDENT_AMBULATORY_CARE_PROVIDER_SITE_OTHER): Payer: Medicaid Other | Admitting: Physician Assistant

## 2023-04-13 VITALS — BP 110/70 | HR 65 | Temp 97.1°F | Ht 65.0 in | Wt 179.0 lb

## 2023-04-13 DIAGNOSIS — F411 Generalized anxiety disorder: Secondary | ICD-10-CM

## 2023-04-13 DIAGNOSIS — E663 Overweight: Secondary | ICD-10-CM

## 2023-04-13 DIAGNOSIS — I639 Cerebral infarction, unspecified: Secondary | ICD-10-CM

## 2023-04-13 MED ORDER — PHENTERMINE HCL 37.5 MG PO CAPS: 37.5000 mg | ORAL_CAPSULE | ORAL | 1 refills | Status: DC

## 2023-04-13 MED ORDER — WEGOVY 0.25 MG/0.5ML ~~LOC~~ SOAJ: 0.2500 mg | SUBCUTANEOUS | 0 refills | Status: DC

## 2023-04-13 MED ORDER — LORAZEPAM 0.5 MG PO TABS: ORAL_TABLET | ORAL | 1 refills | Status: DC

## 2023-04-13 NOTE — Telephone Encounter (Signed)
PA started for Wegovy 25 mg thru Covermymeds. Awaiting determination. KEY: [id number]

## 2023-04-13 NOTE — Patient Instructions (Addendum)
It was great to see you!  We will continue the phentermine I'm going to send in the [facility] to see if it can now be covered due to your history of stroke  Work on regular exercise -- try to find what works for you!  Take care,  Jarold Motto PA-C
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
## 2023-04-14 NOTE — Telephone Encounter (Signed)
PA for Wegovy 0.25 mg has been approved. : Request Reference Number: [id number]. WEGOVY INJ 0.25MG  is approved through 04/12/2024. For further questions, call [employer] at [phone].Marland Kitchen Authorization Expiration Date: April 12, 2024.

## 2023-04-14 NOTE — Telephone Encounter (Signed)
Called [facility] and spoke to [place] told her PA for Reginal Lutes  has been approved for one year. Lucy verbalized understanding.

## 2023-04-14 NOTE — Telephone Encounter (Signed)
Spoke to pt told her PA for Reginal Lutes has been approved for one year, pharmacy is aware and Rx is ready for pickup. Pt verbalized understanding.

## 2023-05-11 ENCOUNTER — Encounter: Payer: Self-pay | Admitting: Physician Assistant

## 2023-05-12 MED ORDER — WEGOVY 0.5 MG/0.5ML ~~LOC~~ SOAJ: 0.5000 mg | SUBCUTANEOUS | 0 refills | Status: DC

## 2023-05-12 NOTE — Addendum Note (Signed)
Addended by: Jimmye Norman on: 05/12/2023 09:11 AM   Modules accepted: Orders

## 2023-05-13 ENCOUNTER — Ambulatory Visit: Payer: Medicaid Other | Admitting: Podiatry

## 2023-05-24 ENCOUNTER — Encounter: Payer: Self-pay | Admitting: Physician Assistant

## 2023-06-01 ENCOUNTER — Encounter: Payer: Self-pay | Admitting: Podiatry

## 2023-06-01 ENCOUNTER — Encounter: Payer: Self-pay | Admitting: Physician Assistant

## 2023-06-01 ENCOUNTER — Ambulatory Visit: Payer: Medicaid Other | Admitting: Physician Assistant

## 2023-06-01 ENCOUNTER — Encounter: Payer: Medicaid Other | Admitting: Podiatry
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
## 2023-06-04 ENCOUNTER — Ambulatory Visit: Payer: Medicaid Other | Admitting: Family

## 2023-06-04 VITALS — BP 113/73 | HR 74 | Temp 97.7°F | Ht 65.0 in | Wt 172.2 lb

## 2023-06-04 DIAGNOSIS — J329 Chronic sinusitis, unspecified: Secondary | ICD-10-CM

## 2023-06-04 DIAGNOSIS — B9789 Other viral agents as the cause of diseases classified elsewhere: Secondary | ICD-10-CM

## 2023-06-04 MED ORDER — PSEUDOEPHEDRINE HCL 60 MG PO TABS
60.0000 mg | ORAL_TABLET | Freq: Two times a day (BID) | ORAL | 0 refills | Status: AC | PRN
Start: 2023-06-04 — End: ?
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
## 2023-06-07 ENCOUNTER — Other Ambulatory Visit: Payer: Self-pay | Admitting: Physician Assistant

## 2023-06-08 ENCOUNTER — Ambulatory Visit (INDEPENDENT_AMBULATORY_CARE_PROVIDER_SITE_OTHER): Payer: Medicaid Other

## 2023-06-08 DIAGNOSIS — Z23 Encounter for immunization: Secondary | ICD-10-CM | POA: Diagnosis not present

## 2023-06-09 NOTE — Progress Notes (Signed)
Patient cancelled

## 2023-06-17 ENCOUNTER — Encounter: Payer: Self-pay | Admitting: Podiatry

## 2023-06-17 ENCOUNTER — Ambulatory Visit (INDEPENDENT_AMBULATORY_CARE_PROVIDER_SITE_OTHER): Payer: Medicaid Other | Admitting: Podiatry

## 2023-06-17 DIAGNOSIS — G5761 Lesion of plantar nerve, right lower limb: Secondary | ICD-10-CM | POA: Diagnosis not present

## 2023-06-17 NOTE — Patient Instructions (Signed)
I would try Aetrex, Superfeet, Aetrex for inserts  ---   While at your visit today you received a steroid injection in your foot or ankle to help with your pain. Along with having the steroid medication there is some "numbing" medication in the shot that you received. Due to this you may notice some numbness to the area for the next couple of hours.   I would recommend limiting activity for the next few days to help the steroid injection take affect.    The actually benefit from the steroid injection may take up to 2-7 days to see a difference. You may actually experience a small (as in 10%) INCREASE in pain in the first 24 hours---that is common. It would be best if you can ice the area today and take anti-inflammatory medications (such as Ibuprofen, Motrin, or Aleve) if you are able to take these medications. If you were prescribed another medication to help with the pain go ahead and start that medication today    Things to watch out for that you should contact us or a health care provider urgently would include: 1. Unusual (as in more than 10%) increase in pain 2. New fever > 101.5 3. New swelling or redness of the injected area.  4. Streaking of red lines around the area injected.  If you have any questions or concerns about this, please give our office a call at [phone].

## 2023-06-19 ENCOUNTER — Other Ambulatory Visit: Payer: Self-pay | Admitting: Physician Assistant

## 2023-06-20 ENCOUNTER — Other Ambulatory Visit: Payer: Self-pay | Admitting: Physician Assistant

## 2023-06-21 NOTE — Progress Notes (Signed)
Subjective: Chief Complaint  Patient presents with   Foot Pain    RM12: Patient is here for 6 week F/U for right foot pain Possible 2nd injection pain is starting to come back     52 year old female presents the office with above concerns.  She states that she was doing well however recently the pain started coming back.  No recent injury or changes otherwise that she reports.  No new concerns.     Objective: AAO x3, NAD DP/PT pulses palpable bilaterally, CRT less than 3 seconds Tenderness palpation along the second interspace.  I am still able to palpate a small neuroma left second interspace today.  Positive clicking sensation.  No area pinpoint tenderness.  No edema, erythema. 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  -Clinically able to palpate a neuroma today.  She was proceed with other injections.  She has some mild skin discoloration from prior injections we discussed this could worsen as well as other risks of the injection discussed.  Skin was cleaned with alcohol and mixture 1 cc Kenalog 10, 0.5 cc of Marcaine plain, 0.5 cc of lidocaine plain was infiltrated into the area of maximal tenderness on secondary complications.  Postinjection care discussed.  Follow-up. -I am under MRI for an overread. -We discussed surgical intervention as well.  No follow-ups on file.  Vivi Barrack DPM

## 2023-06-25 ENCOUNTER — Encounter: Payer: Self-pay | Admitting: Physician Assistant

## 2023-07-14 ENCOUNTER — Telehealth: Payer: Self-pay | Admitting: Podiatry

## 2023-07-14 NOTE — Telephone Encounter (Signed)
Pt called thinking she had an appt today but it was not scheduled. She stated you seen her last visit and was to have had the mri sent out for a second opinion. Please advise if pt needs appt to discuss further?

## 2023-07-19 ENCOUNTER — Other Ambulatory Visit: Payer: Self-pay | Admitting: Physician Assistant

## 2023-07-19 MED ORDER — WEGOVY 1 MG/0.5ML ~~LOC~~ SOAJ: 1.0000 mg | SUBCUTANEOUS | 0 refills | Status: DC

## 2023-07-29 ENCOUNTER — Telehealth: Payer: Self-pay | Admitting: Podiatry

## 2023-07-29 NOTE — Telephone Encounter (Signed)
Pt called and had the mri sent off for a second opinion and has not heard back yet. Any updates?

## 2023-08-05 ENCOUNTER — Telehealth: Payer: Self-pay | Admitting: Podiatry

## 2023-08-05 NOTE — Telephone Encounter (Signed)
Hello, this pt stated she has reached out a few times in regards to her mri being sent off for a second opinion. She would like to know if she could please get some answers, she is in pain. She has no idea what her next steps are and would like for you to call her  [phone]  Thanks!

## 2023-08-09 ENCOUNTER — Other Ambulatory Visit: Payer: Self-pay | Admitting: Physician Assistant

## 2023-08-09 MED ORDER — WEGOVY 1 MG/0.5ML ~~LOC~~ SOAJ: 1.0000 mg | SUBCUTANEOUS | 0 refills | Status: DC

## 2023-08-15 ENCOUNTER — Other Ambulatory Visit: Payer: Self-pay | Admitting: Physician Assistant

## 2023-08-15 DIAGNOSIS — J310 Chronic rhinitis: Secondary | ICD-10-CM

## 2023-08-17 ENCOUNTER — Telehealth: Payer: Self-pay | Admitting: Podiatry

## 2023-08-17 ENCOUNTER — Encounter: Payer: Self-pay | Admitting: Podiatry

## 2023-08-17 NOTE — Telephone Encounter (Signed)
Hello, this pt stated she has reached out a few times in regards to her mri being sent off for a second opinion. She would like to know if she could please get some answers, she is in pain. She has no idea what her next steps are and would like for you to call her  [phone] pt has been reaching out since 07/14/23 and would like someone to call her.  Thanks!

## 2023-08-19 ENCOUNTER — Encounter: Payer: Self-pay | Admitting: Podiatry
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
## 2023-08-30 ENCOUNTER — Ambulatory Visit (INDEPENDENT_AMBULATORY_CARE_PROVIDER_SITE_OTHER): Payer: Medicaid Other | Admitting: Physician Assistant

## 2023-08-30 ENCOUNTER — Encounter: Payer: Self-pay | Admitting: Physician Assistant

## 2023-08-30 ENCOUNTER — Other Ambulatory Visit: Payer: Self-pay | Admitting: Physician Assistant

## 2023-08-30 VITALS — BP 110/70 | HR 73 | Temp 97.5°F | Ht 65.0 in | Wt 154.0 lb

## 2023-08-30 DIAGNOSIS — Z0000 Encounter for general adult medical examination without abnormal findings: Secondary | ICD-10-CM | POA: Diagnosis not present

## 2023-08-30 DIAGNOSIS — J329 Chronic sinusitis, unspecified: Secondary | ICD-10-CM

## 2023-08-30 DIAGNOSIS — J4520 Mild intermittent asthma, uncomplicated: Secondary | ICD-10-CM

## 2023-08-30 DIAGNOSIS — E663 Overweight: Secondary | ICD-10-CM

## 2023-08-30 DIAGNOSIS — F411 Generalized anxiety disorder: Secondary | ICD-10-CM

## 2023-08-30 DIAGNOSIS — I639 Cerebral infarction, unspecified: Secondary | ICD-10-CM | POA: Diagnosis not present

## 2023-08-30 DIAGNOSIS — D72819 Decreased white blood cell count, unspecified: Secondary | ICD-10-CM

## 2023-08-30 LAB — COMPREHENSIVE METABOLIC PANEL
ALT: 14 U/L (ref 0–35)
AST: 17 U/L (ref 0–37)
Albumin: 4.2 g/dL (ref 3.5–5.2)
Alkaline Phosphatase: 79 U/L (ref 39–117)
BUN: 11 mg/dL (ref 6–23)
CO2: 26 meq/L (ref 19–32)
Calcium: 9.6 mg/dL (ref 8.4–10.5)
Chloride: 104 meq/L (ref 96–112)
Creatinine, Ser: 0.7 mg/dL (ref 0.40–1.20)
GFR: 97.8 mL/min (ref >60.00)
Glucose, Bld: 84 mg/dL (ref 70–99)
Potassium: 3.8 meq/L (ref 3.5–5.1)
Sodium: 138 meq/L (ref 135–145)
Total Bilirubin: 0.8 mg/dL (ref 0.2–1.2)
Total Protein: 6.8 g/dL (ref 6.0–8.3)

## 2023-08-30 LAB — CBC WITH DIFFERENTIAL/PLATELET
Basophils Absolute: 0 10*3/uL (ref 0.0–0.1)
Basophils Relative: 0.5 % (ref 0.0–3.0)
Eosinophils Absolute: 0.1 10*3/uL (ref 0.0–0.7)
Eosinophils Relative: 1.8 % (ref 0.0–5.0)
HCT: 36.3 % (ref 36.0–46.0)
Hemoglobin: 12 g/dL (ref 12.0–15.0)
Lymphocytes Relative: 48.4 % — ABNORMAL HIGH (ref 12.0–46.0)
Lymphs Abs: 1.6 10*3/uL (ref 0.7–4.0)
MCHC: 33.1 g/dL (ref 30.0–36.0)
MCV: 84.4 fL (ref 78.0–100.0)
Monocytes Absolute: 0.4 10*3/uL (ref 0.1–1.0)
Monocytes Relative: 13.1 % — ABNORMAL HIGH (ref 3.0–12.0)
Neutro Abs: 1.2 10*3/uL — ABNORMAL LOW (ref 1.4–7.7)
Neutrophils Relative %: 36.2 % — ABNORMAL LOW (ref 43.0–77.0)
Platelets: 226 10*3/uL (ref 150.0–400.0)
RBC: 4.3 Mil/uL (ref 3.87–5.11)
RDW: 16.3 % — ABNORMAL HIGH (ref 11.5–15.5)
WBC: 3.4 10*3/uL — ABNORMAL LOW (ref 4.0–10.5)

## 2023-08-30 LAB — LIPID PANEL
Cholesterol: 135 mg/dL (ref 0–200)
HDL: 45 mg/dL (ref >39.00)
LDL Cholesterol: 81 mg/dL (ref 0–99)
NonHDL: 89.59
Total CHOL/HDL Ratio: 3
Triglycerides: 43 mg/dL (ref 0.0–149.0)
VLDL: 8.6 mg/dL (ref 0.0–40.0)

## 2023-08-30 MED ORDER — WEGOVY 1.7 MG/0.75ML ~~LOC~~ SOAJ: 1.7000 mg | SUBCUTANEOUS | 1 refills | Status: DC

## 2023-08-30 MED ORDER — DOXYCYCLINE HYCLATE 100 MG PO TABS: 100.0000 mg | ORAL_TABLET | Freq: Two times a day (BID) | ORAL | 0 refills | Status: DC

## 2023-08-30 NOTE — Patient Instructions (Signed)
It was great to see you!  Try to start taking HALF a tablet of your ativan in the morning If this does not work well for you, you can return to full dosage  Increase Wegovy to 1.7 mg weekly  I will send in antibiotic(s) for your sinuses -- but please try the sudafed at first home and see if that can improve symptom(s)   Please go to the lab for blood work.   Our office will call you with your results unless you have chosen to receive results via MyChart.  If your blood work is normal we will follow-up each year for physicals and as scheduled for chronic medical problems.  If anything is abnormal we will treat accordingly and get you in for a follow-up.  Take care,  Lelon Mast

## 2023-08-31 ENCOUNTER — Telehealth: Payer: Self-pay | Admitting: Physician Assistant

## 2023-08-31 NOTE — Telephone Encounter (Signed)
See result notes. 

## 2023-08-31 NOTE — Telephone Encounter (Signed)
Pt would like a call back with lab results 

## 2023-09-06 ENCOUNTER — Encounter: Payer: Self-pay | Admitting: Physician Assistant

## 2023-09-06 NOTE — Telephone Encounter (Signed)
Christine Cunningham, can you please schedule an appointment for her? Thanks!

## 2023-09-07 ENCOUNTER — Telehealth: Payer: Self-pay | Admitting: Podiatry

## 2023-09-07 NOTE — Telephone Encounter (Signed)
Left message for pt to call to schedule an appt with Dr Ardelle Anton.

## 2023-09-08 ENCOUNTER — Telehealth: Payer: Self-pay | Admitting: Podiatry

## 2023-09-08 NOTE — Telephone Encounter (Signed)
Lvm for pt to call to schedule an appt to discuss options with Dr Ardelle Anton.

## 2023-09-14 ENCOUNTER — Encounter: Payer: Self-pay | Admitting: Physician Assistant

## 2023-09-14 ENCOUNTER — Other Ambulatory Visit (INDEPENDENT_AMBULATORY_CARE_PROVIDER_SITE_OTHER): Payer: Medicaid Other

## 2023-09-14 DIAGNOSIS — D72819 Decreased white blood cell count, unspecified: Secondary | ICD-10-CM

## 2023-09-14 LAB — CBC WITH DIFFERENTIAL/PLATELET
Basophils Absolute: 0 10*3/uL (ref 0.0–0.1)
Basophils Relative: 0.5 % (ref 0.0–3.0)
Eosinophils Absolute: 0.1 10*3/uL (ref 0.0–0.7)
Eosinophils Relative: 1.8 % (ref 0.0–5.0)
HCT: 34.9 % — ABNORMAL LOW (ref 36.0–46.0)
Hemoglobin: 11.5 g/dL — ABNORMAL LOW (ref 12.0–15.0)
Lymphocytes Relative: 47 % — ABNORMAL HIGH (ref 12.0–46.0)
Lymphs Abs: 1.8 10*3/uL (ref 0.7–4.0)
MCHC: 32.9 g/dL (ref 30.0–36.0)
MCV: 84.8 fL (ref 78.0–100.0)
Monocytes Absolute: 0.4 10*3/uL (ref 0.1–1.0)
Monocytes Relative: 11 % (ref 3.0–12.0)
Neutro Abs: 1.6 10*3/uL (ref 1.4–7.7)
Neutrophils Relative %: 39.7 % — ABNORMAL LOW (ref 43.0–77.0)
Platelets: 230 10*3/uL (ref 150.0–400.0)
RBC: 4.11 Mil/uL (ref 3.87–5.11)
RDW: 16.6 % — ABNORMAL HIGH (ref 11.5–15.5)
WBC: 3.9 10*3/uL — ABNORMAL LOW (ref 4.0–10.5)

## 2023-09-14 MED ORDER — GABAPENTIN 100 MG PO CAPS: 100.0000 mg | ORAL_CAPSULE | Freq: Every day | ORAL | 1 refills | Status: DC

## 2023-09-16 ENCOUNTER — Telehealth: Payer: Self-pay | Admitting: Physician Assistant

## 2023-09-16 ENCOUNTER — Other Ambulatory Visit: Payer: Self-pay | Admitting: Physician Assistant

## 2023-09-16 DIAGNOSIS — D72819 Decreased white blood cell count, unspecified: Secondary | ICD-10-CM

## 2023-09-16 DIAGNOSIS — D649 Anemia, unspecified: Secondary | ICD-10-CM

## 2023-09-16 NOTE — Telephone Encounter (Signed)
See result notes. 

## 2023-09-16 NOTE — Telephone Encounter (Signed)
Pt would like a call back with lab results 

## 2023-09-17 ENCOUNTER — Other Ambulatory Visit: Payer: Self-pay | Admitting: Physician Assistant

## 2023-09-20 ENCOUNTER — Ambulatory Visit (INDEPENDENT_AMBULATORY_CARE_PROVIDER_SITE_OTHER): Payer: Medicaid Other | Admitting: Podiatry

## 2023-09-20 ENCOUNTER — Encounter: Payer: Self-pay | Admitting: Podiatry

## 2023-09-20 DIAGNOSIS — M7741 Metatarsalgia, right foot: Secondary | ICD-10-CM

## 2023-09-20 DIAGNOSIS — D492 Neoplasm of unspecified behavior of bone, soft tissue, and skin: Secondary | ICD-10-CM | POA: Diagnosis not present

## 2023-09-20 NOTE — Patient Instructions (Signed)
Keep the bandage on for 24 hours. At that time, remove and clean with soap and water. If it hurts or burns before 24 hours go ahead and remove the bandage and wash with soap and water. Keep the area clean. If there is any blistering cover with antibiotic ointment and a bandage. Monitor for any redness, drainage, or other signs of infection. Call the office if any are to occur. If you have any questions, please call the office at [phone].  -  WEARING INSTRUCTIONS FOR ORTHOTICS  Don't expect to be comfortable wearing your orthotic devices for the first time.  Like eyeglasses, you may be aware of them as time passes, they will not be uncomfortable and you will enjoy wearing them.  FOLLOW THESE INSTRUCTIONS EXACTLY!  Wear your orthotic devices for:       Not more than 1 hour the first day.       Not more than 2 hours the second day.       Not more than 3 hours the third day and so on.        Or wear them for as long as they feel comfortable.       If you experience discomfort in your feet or legs take them out.  When feet & legs feel       better, put them back in.  You do need to be consistent and wear them a little        everyday. 2.   If at any time the orthotic devices become acutely uncomfortable before the       time for that particular day, STOP WEARING THEM. 3.   On the next day, do not increase the wearing time. 4.   Subsequently, increase the wearing time by 15-30 minutes only if comfortable to do       so. 5.   You will be seen by your doctor about 2-4 weeks after you receive your orthotic       devices, at which time you will probably be wearing your devices comfortably        for about 8 hours or more a day. 6.   Some patients occasionally report mild aches or discomfort in other parts of the of       body such as the knees, hips or back after 3 or 4 consecutive hours of wear.  If this       is the case with you, do not extend your wearing time.  Instead, cut it back an  hour or       two.  In all likelihood, these symptoms will disappear in a short period of time as your       body posture realigns itself and functions more efficiently. 7.   It is possible that your orthotic device may require some small changes or adjustment       to improve their function or make them more comfortable.   This is usually not done       before one to three months have elapsed.  These adjustments are made in        accordance with the changed position your feet are assuming as a result of       improved biomechanical function. 8.   In women's shoes, it's not unusual for your heel to slip out of the shoe, particularly if       they are step-in-shoes.  If this is the case, try other shoes  or other styles.  Try to       purchase shoes which have deeper heal seats or higher heel counters. 9.   Squeaking of orthotics devices in the shoes is due to the movement of the devices       when they are functioning normally.  To eliminate squeaking, simply dust some       baby powder into your shoes before inserting the devices.  If this does not work,        apply soap or wax to the edges of the orthotic devices or put a tissue into the shoes. 10. It is important that you follow these directions explicitly.  Failure to do so will simply       prolong the adjustment period or create problems which are easily avoided.  It makes       no difference if you are wearing your orthotic devices for only a few hours after        several months, so long as you are wearing them comfortably for those hours. 11. If you have any questions or complaints, contact our office.  We have no way of       knowing about your problems unless you tell us.  If we do not hear from you, we will       assume that you are proceeding well.

## 2023-09-20 NOTE — Progress Notes (Signed)
Subjective: Chief Complaint  Patient presents with   Foot Pain    RM#13 Patient would like to discuss options for pain.     52 year old female presents the office with above concerns.  She is still getting pain to the ball of the foot.  She developed a new callus to the area as well which is causing pain.  She states that she has moderate pain when she is trying to walk into the ball of the foot.  No radiating pain.  Objective: AAO x3, NAD DP/PT pulses palpable bilaterally, CRT less than 3 seconds Tenderness palpation along the second interspace.  But today there is new callus formation submetatarsal 2 which she is getting quite a bit of discomfort.  Upon debridement there is no underlying ulceration, drainage or any signs of infection noted today.  There is no other areas of discomfort identified at this time.  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  -Again reviewed the MRIs.  She is still small in the room but admits that symptoms today seem to be along the area of skin lesion.  Sharp debrided lesion with any complications or bleeding.  I cleaned the skin with alcohol.  I was placed about salicylic acid and a bandage.  Postprocedure process discussed.  Monitoring signs or symptoms of infection. -Dispensed power step inserts to help support the foot.  Metatarsal support.   Return in about 2 months (around 11/21/2023).  Vivi Barrack DPM

## 2023-09-30 ENCOUNTER — Other Ambulatory Visit: Payer: Medicaid Other

## 2023-09-30 DIAGNOSIS — D72819 Decreased white blood cell count, unspecified: Secondary | ICD-10-CM | POA: Diagnosis not present

## 2023-09-30 DIAGNOSIS — D649 Anemia, unspecified: Secondary | ICD-10-CM | POA: Diagnosis not present

## 2023-09-30 LAB — B12 AND FOLATE PANEL
Folate: 8.5 ng/mL (ref >5.9)
Vitamin B-12: 554 pg/mL (ref 211–911)

## 2023-09-30 LAB — IBC + FERRITIN
Ferritin: 74.8 ng/mL (ref 10.0–291.0)
Iron: 72 ug/dL (ref 42–145)
Saturation Ratios: 26 % (ref 20.0–50.0)
TIBC: 277.2 ug/dL (ref 250.0–450.0)
Transferrin: 198 mg/dL — ABNORMAL LOW (ref 212.0–360.0)

## 2023-09-30 LAB — CBC WITH DIFFERENTIAL/PLATELET
Basophils Absolute: 0 10*3/uL (ref 0.0–0.1)
Basophils Relative: 0.6 % (ref 0.0–3.0)
Eosinophils Absolute: 0.1 10*3/uL (ref 0.0–0.7)
Eosinophils Relative: 2.7 % (ref 0.0–5.0)
HCT: 35.7 % — ABNORMAL LOW (ref 36.0–46.0)
Hemoglobin: 11.6 g/dL — ABNORMAL LOW (ref 12.0–15.0)
Lymphocytes Relative: 45.9 % (ref 12.0–46.0)
Lymphs Abs: 1.9 10*3/uL (ref 0.7–4.0)
MCHC: 32.6 g/dL (ref 30.0–36.0)
MCV: 85.2 fL (ref 78.0–100.0)
Monocytes Absolute: 0.5 10*3/uL (ref 0.1–1.0)
Monocytes Relative: 11.2 % (ref 3.0–12.0)
Neutro Abs: 1.7 10*3/uL (ref 1.4–7.7)
Neutrophils Relative %: 39.6 % — ABNORMAL LOW (ref 43.0–77.0)
Platelets: 241 10*3/uL (ref 150.0–400.0)
RBC: 4.19 Mil/uL (ref 3.87–5.11)
RDW: 16.6 % — ABNORMAL HIGH (ref 11.5–15.5)
WBC: 4.2 10*3/uL (ref 4.0–10.5)

## 2023-10-01 ENCOUNTER — Other Ambulatory Visit (INDEPENDENT_AMBULATORY_CARE_PROVIDER_SITE_OTHER): Payer: Medicaid Other

## 2023-10-01 ENCOUNTER — Other Ambulatory Visit: Payer: Self-pay | Admitting: *Deleted

## 2023-10-01 DIAGNOSIS — D649 Anemia, unspecified: Secondary | ICD-10-CM | POA: Diagnosis not present

## 2023-10-01 DIAGNOSIS — D72819 Decreased white blood cell count, unspecified: Secondary | ICD-10-CM | POA: Diagnosis not present

## 2023-10-01 LAB — POC HEMOCCULT BLD/STL (HOME/3-CARD/SCREEN)
Card #2 Fecal Occult Blod, POC: NEGATIVE
Card #3 Fecal Occult Blood, POC: NEGATIVE
Fecal Occult Blood, POC: NEGATIVE

## 2023-10-02 ENCOUNTER — Other Ambulatory Visit: Payer: Self-pay | Admitting: Physician Assistant

## 2023-10-11 ENCOUNTER — Encounter: Payer: Self-pay | Admitting: Physician Assistant

## 2023-10-11 ENCOUNTER — Other Ambulatory Visit: Payer: Self-pay

## 2023-10-11 MED ORDER — LORAZEPAM 0.5 MG PO TABS: ORAL_TABLET | ORAL | 1 refills | Status: DC

## 2023-10-11 NOTE — Telephone Encounter (Signed)
 Pt requesting refill on Lorazepam   Last OV: 08/30/23  Next OV: none scheduled  Last Filled: 04/13/23  Quantity: 90 w/ 1 refill

## 2023-10-13 ENCOUNTER — Other Ambulatory Visit: Payer: Self-pay | Admitting: Physician Assistant

## 2023-10-20 ENCOUNTER — Encounter: Payer: Self-pay | Admitting: Physician Assistant

## 2023-10-27 ENCOUNTER — Other Ambulatory Visit: Payer: Self-pay | Admitting: Physician Assistant

## 2023-10-27 DIAGNOSIS — Z1231 Encounter for screening mammogram for malignant neoplasm of breast: Secondary | ICD-10-CM

## 2023-10-28 ENCOUNTER — Ambulatory Visit
Admission: RE | Admit: 2023-10-28 | Discharge: 2023-10-28 | Discharge status: Home / Self Care | Payer: Medicaid Other | Source: Ambulatory Visit | Attending: Physician Assistant

## 2023-10-28 DIAGNOSIS — Z1231 Encounter for screening mammogram for malignant neoplasm of breast: Secondary | ICD-10-CM

## 2023-11-02 ENCOUNTER — Other Ambulatory Visit: Payer: Self-pay | Admitting: Physician Assistant

## 2023-11-04 ENCOUNTER — Ambulatory Visit: Payer: Medicaid Other | Admitting: Physician Assistant

## 2023-11-04 ENCOUNTER — Encounter: Payer: Self-pay | Admitting: Physician Assistant

## 2023-11-04 VITALS — BP 110/70 | HR 86 | Temp 97.4°F | Ht 65.0 in | Wt 151.4 lb

## 2023-11-04 DIAGNOSIS — E663 Overweight: Secondary | ICD-10-CM | POA: Diagnosis not present

## 2023-11-04 DIAGNOSIS — J329 Chronic sinusitis, unspecified: Secondary | ICD-10-CM

## 2023-11-04 MED ORDER — ONDANSETRON HCL 4 MG PO TABS: 4.0000 mg | ORAL_TABLET | Freq: Three times a day (TID) | ORAL | 0 refills | Status: DC | PRN

## 2023-11-04 MED ORDER — AMOXICILLIN-POT CLAVULANATE 875-125 MG PO TABS: 1.0000 | ORAL_TABLET | Freq: Two times a day (BID) | ORAL | 0 refills | Status: DC
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
## 2023-11-04 NOTE — Patient Instructions (Signed)
It was great to see you!  Continue Wegovy 1.7 mg weekly  Start antibiotic for your sinus infection     Consider Body Pump at the [facility] to start toning!    Take care,  Jarold Motto PA-C
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
## 2023-11-09 ENCOUNTER — Inpatient Hospital Stay: Payer: Medicaid Other

## 2023-11-09 ENCOUNTER — Inpatient Hospital Stay (HOSPITAL_BASED_OUTPATIENT_CLINIC_OR_DEPARTMENT_OTHER): Payer: Medicaid Other

## 2023-11-09 VITALS — BP 111/84 | HR 97 | Temp 97.2°F | Resp 18 | Wt 149.0 lb

## 2023-11-09 DIAGNOSIS — D649 Anemia, unspecified: Secondary | ICD-10-CM | POA: Diagnosis not present

## 2023-11-09 DIAGNOSIS — Z9079 Acquired absence of other genital organ(s): Secondary | ICD-10-CM | POA: Insufficient documentation

## 2023-11-09 DIAGNOSIS — Z87891 Personal history of nicotine dependence: Secondary | ICD-10-CM | POA: Diagnosis not present

## 2023-11-09 DIAGNOSIS — Z90710 Acquired absence of both cervix and uterus: Secondary | ICD-10-CM | POA: Insufficient documentation

## 2023-11-09 DIAGNOSIS — Z7982 Long term (current) use of aspirin: Secondary | ICD-10-CM | POA: Diagnosis not present

## 2023-11-09 LAB — CBC WITH DIFFERENTIAL (CANCER CENTER ONLY)
Abs Immature Granulocytes: 0.01 10*3/uL (ref 0.00–0.07)
Basophils Absolute: 0 10*3/uL (ref 0.0–0.1)
Basophils Relative: 1 %
Eosinophils Absolute: 0.1 10*3/uL (ref 0.0–0.5)
Eosinophils Relative: 2 %
HCT: 37.2 % (ref 36.0–46.0)
Hemoglobin: 12.1 g/dL (ref 12.0–15.0)
Immature Granulocytes: 0 %
Lymphocytes Relative: 46 %
Lymphs Abs: 2 10*3/uL (ref 0.7–4.0)
MCH: 27.9 pg (ref 26.0–34.0)
MCHC: 32.5 g/dL (ref 30.0–36.0)
MCV: 85.7 fL (ref 80.0–100.0)
Monocytes Absolute: 0.4 10*3/uL (ref 0.1–1.0)
Monocytes Relative: 9 %
Neutro Abs: 1.8 10*3/uL (ref 1.7–7.7)
Neutrophils Relative %: 42 %
Platelet Count: 246 10*3/uL (ref 150–400)
RBC: 4.34 MIL/uL (ref 3.87–5.11)
RDW: 15.8 % — ABNORMAL HIGH (ref 11.5–15.5)
WBC Count: 4.4 10*3/uL (ref 4.0–10.5)
nRBC: 0 % (ref 0.0–0.2)

## 2023-11-09 LAB — CMP (CANCER CENTER ONLY)
ALT: 13 U/L (ref 0–44)
AST: 15 U/L (ref 15–41)
Albumin: 4.4 g/dL (ref 3.5–5.0)
Alkaline Phosphatase: 93 U/L (ref 38–126)
Anion gap: 4 — ABNORMAL LOW (ref 5–15)
BUN: 9 mg/dL (ref 6–20)
CO2: 30 mmol/L (ref 22–32)
Calcium: 9.7 mg/dL (ref 8.9–10.3)
Chloride: 106 mmol/L (ref 98–111)
Creatinine: 0.76 mg/dL (ref 0.44–1.00)
GFR, Estimated: >60 mL/min (ref >60)
Glucose, Bld: 87 mg/dL (ref 70–99)
Potassium: 3.8 mmol/L (ref 3.5–5.1)
Sodium: 140 mmol/L (ref 135–145)
Total Bilirubin: 0.5 mg/dL (ref 0.0–1.2)
Total Protein: 7.2 g/dL (ref 6.5–8.1)

## 2023-11-09 LAB — FOLATE: Folate: 11.6 ng/mL (ref >5.9)

## 2023-11-09 LAB — IRON AND IRON BINDING CAPACITY (CC-WL,HP ONLY)
Iron: 65 ug/dL (ref 28–170)
Saturation Ratios: 23 % (ref 10.4–31.8)
TIBC: 287 ug/dL (ref 250–450)
UIBC: 222 ug/dL (ref 148–442)

## 2023-11-09 LAB — VITAMIN B12: Vitamin B-12: 735 pg/mL (ref 180–914)

## 2023-11-09 LAB — LACTATE DEHYDROGENASE: LDH: 144 U/L (ref 98–192)

## 2023-11-09 LAB — FERRITIN: Ferritin: 84 ng/mL (ref 11–307)

## 2023-11-10 LAB — KAPPA/LAMBDA LIGHT CHAINS
Kappa free light chain: 17.7 mg/L (ref 3.3–19.4)
Kappa, lambda light chain ratio: 1.81 — ABNORMAL HIGH (ref 0.26–1.65)
Lambda free light chains: 9.8 mg/L (ref 5.7–26.3)

## 2023-11-10 LAB — IGG, IGA, IGM
IgA: 76 mg/dL — ABNORMAL LOW (ref 87–352)
IgG (Immunoglobin G), Serum: 1145 mg/dL (ref 586–1602)
IgM (Immunoglobulin M), Srm: 164 mg/dL (ref 26–217)

## 2023-11-11 LAB — METHYLMALONIC ACID, SERUM: Methylmalonic Acid, Quantitative: 112 nmol/L (ref 0–378)

## 2023-11-15 LAB — PROTEIN ELECTROPHORESIS, SERUM, WITH REFLEX
A/G Ratio: 1.4 (ref 0.7–1.7)
Albumin ELP: 3.9 g/dL (ref 2.9–4.4)
Alpha-1-Globulin: 0.2 g/dL (ref 0.0–0.4)
Alpha-2-Globulin: 0.6 g/dL (ref 0.4–1.0)
Beta Globulin: 0.8 g/dL (ref 0.7–1.3)
Gamma Globulin: 1.1 g/dL (ref 0.4–1.8)
Globulin, Total: 2.7 g/dL (ref 2.2–3.9)
Total Protein ELP: 6.6 g/dL (ref 6.0–8.5)

## 2023-11-16 ENCOUNTER — Other Ambulatory Visit: Payer: Self-pay

## 2023-11-16 ENCOUNTER — Encounter: Payer: Self-pay | Admitting: Physician Assistant

## 2023-11-16 DIAGNOSIS — R768 Other specified abnormal immunological findings in serum: Secondary | ICD-10-CM

## 2023-11-16 MED ORDER — VENTOLIN HFA 108 (90 BASE) MCG/ACT IN AERS: 1.0000 | INHALATION_SPRAY | Freq: Four times a day (QID) | RESPIRATORY_TRACT | 1 refills | Status: DC | PRN

## 2023-11-23 ENCOUNTER — Ambulatory Visit: Payer: Medicaid Other | Admitting: Podiatry

## 2023-11-26 ENCOUNTER — Telehealth: Payer: Self-pay | Admitting: Podiatry

## 2023-12-02 ENCOUNTER — Ambulatory Visit: Payer: Medicaid Other | Admitting: Physician Assistant

## 2023-12-02 ENCOUNTER — Encounter: Payer: Self-pay | Admitting: Physician Assistant

## 2023-12-02 VITALS — BP 100/70 | HR 110 | Temp 97.3°F | Ht 65.0 in | Wt 143.5 lb

## 2023-12-02 DIAGNOSIS — R059 Cough, unspecified: Secondary | ICD-10-CM

## 2023-12-02 LAB — POC INFLUENZA A&B (BINAX/QUICKVUE)
Influenza A, POC: NEGATIVE
Influenza B, POC: NEGATIVE

## 2023-12-02 LAB — POC COVID19 BINAXNOW: SARS Coronavirus 2 Ag: NEGATIVE

## 2023-12-02 MED ORDER — BENZONATATE 100 MG PO CAPS: 100.0000 mg | ORAL_CAPSULE | Freq: Three times a day (TID) | ORAL | 1 refills | Status: DC | PRN

## 2023-12-02 MED ORDER — DOXYCYCLINE HYCLATE 100 MG PO TABS: 100.0000 mg | ORAL_TABLET | Freq: Two times a day (BID) | ORAL | 0 refills | Status: DC

## 2023-12-02 NOTE — Patient Instructions (Signed)
 Marland Kitchen
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
## 2023-12-13 ENCOUNTER — Ambulatory Visit: Payer: Medicaid Other | Admitting: Podiatry

## 2023-12-17 ENCOUNTER — Encounter: Payer: Self-pay | Admitting: Physician Assistant

## 2023-12-17 MED ORDER — AZELASTINE HCL 0.1 % NA SOLN: 1.0000 | Freq: Two times a day (BID) | NASAL | 2 refills | Status: AC

## 2023-12-17 MED ORDER — WEGOVY 1.7 MG/0.75ML ~~LOC~~ SOAJ: 1.7000 mg | SUBCUTANEOUS | 2 refills | Status: DC

## 2023-12-20 ENCOUNTER — Ambulatory Visit (INDEPENDENT_AMBULATORY_CARE_PROVIDER_SITE_OTHER): Payer: Medicaid Other | Admitting: Otolaryngology

## 2023-12-20 ENCOUNTER — Encounter (INDEPENDENT_AMBULATORY_CARE_PROVIDER_SITE_OTHER): Payer: Self-pay | Admitting: Otolaryngology

## 2023-12-20 VITALS — BP 114/75 | HR 88 | Ht 65.0 in | Wt 149.0 lb

## 2023-12-20 DIAGNOSIS — Z87828 Personal history of other (healed) physical injury and trauma: Secondary | ICD-10-CM

## 2023-12-20 DIAGNOSIS — R0982 Postnasal drip: Secondary | ICD-10-CM

## 2023-12-20 DIAGNOSIS — J3089 Other allergic rhinitis: Secondary | ICD-10-CM

## 2023-12-20 DIAGNOSIS — R0981 Nasal congestion: Secondary | ICD-10-CM

## 2023-12-20 DIAGNOSIS — J329 Chronic sinusitis, unspecified: Secondary | ICD-10-CM

## 2023-12-20 DIAGNOSIS — J343 Hypertrophy of nasal turbinates: Secondary | ICD-10-CM

## 2023-12-20 DIAGNOSIS — J342 Deviated nasal septum: Secondary | ICD-10-CM

## 2023-12-20 MED ORDER — FLUTICASONE PROPIONATE 50 MCG/ACT NA SUSP: 2.0000 | Freq: Two times a day (BID) | NASAL | 6 refills | Status: AC

## 2023-12-20 NOTE — Patient Instructions (Signed)
 Lloyd Huger Med Nasal Saline Rinse   - start nasal saline rinses with [employer]Med Bottle available over the counter or online to help with nasal congestion
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
## 2023-12-24 ENCOUNTER — Encounter: Payer: Self-pay | Admitting: Podiatry

## 2023-12-24 ENCOUNTER — Ambulatory Visit: Payer: Medicaid Other | Admitting: Podiatry

## 2023-12-24 DIAGNOSIS — D492 Neoplasm of unspecified behavior of bone, soft tissue, and skin: Secondary | ICD-10-CM

## 2023-12-24 NOTE — Progress Notes (Signed)
 Subjective: Chief Complaint  Patient presents with   Foot Pain    RM#11 Follow up right foot pain patient states starting hurting again 1 month ago.   53 year old female presents the office above concerns.  She said that she was doing very good after her last appointment her pain resolved over the last couple weeks the pain started to come back.  She thinks is coming from the skin lesion, callus on the right foot.  Power step inserts have been helping.  She had no complications after last treatment.  No other concerns.  Objective: AAO x3, NAD DP/PT pulses palpable bilaterally, CRT less than 3 seconds On the right foot submetatarsal is a hyperkeratotic lesion without any underlying ulceration, drainage or signs of infection.  No puncture wound.  Tenderness palpation documents area.  No other areas of pinpoint tenderness.   No pain with calf compression, swelling, warmth, erythema  Assessment: Skin lesion right foot  Plan: -All treatment options discussed with the patient including all alternatives, risks, complications.  -Sharply debrided the hyperkeratotic lesion without any complications or bleeding. Clean the skin with alcohol.  Pad was placed about salicylic acid and bandage.  Postprocedure instructions discussed.  Monitoring signs or symptoms of infection.  Continue offloading.  I have metatarsal pad to her insert. -Patient encouraged to call the office with any questions, concerns, change in symptoms.   Return if symptoms worsen or fail to improve.  Vivi Barrack DPM

## 2023-12-24 NOTE — Patient Instructions (Signed)
 Keep the bandage on for 24 hours. At that time, remove and clean with soap and water. If it hurts or burns before 24 hours go ahead and remove the bandage and wash with soap and water. Keep the area clean. If there is any blistering cover with antibiotic ointment and a bandage. Monitor for any redness, drainage, or other signs of infection. Call the office if any are to occur. If you have any questions, please call the office at [phone].

## 2023-12-29 ENCOUNTER — Encounter: Payer: Self-pay | Admitting: Physician Assistant

## 2023-12-30 MED ORDER — ASPIRIN 81 MG PO TBEC: 81.0000 mg | DELAYED_RELEASE_TABLET | Freq: Every day | ORAL | 3 refills | Status: DC

## 2024-01-06 ENCOUNTER — Encounter: Payer: Self-pay | Admitting: Physician Assistant

## 2024-01-06 NOTE — Telephone Encounter (Signed)
 Please see patient message and advise on increase dose

## 2024-01-07 ENCOUNTER — Ambulatory Visit (HOSPITAL_COMMUNITY)
Admission: RE | Admit: 2024-01-07 | Discharge: 2024-01-07 | Disposition: A | Discharge status: Home / Self Care | Source: Ambulatory Visit | Attending: Otolaryngology | Admitting: Otolaryngology

## 2024-01-07 DIAGNOSIS — R0981 Nasal congestion: Secondary | ICD-10-CM | POA: Insufficient documentation

## 2024-01-07 DIAGNOSIS — J329 Chronic sinusitis, unspecified: Secondary | ICD-10-CM | POA: Diagnosis present

## 2024-01-13 ENCOUNTER — Other Ambulatory Visit: Payer: Self-pay

## 2024-01-13 MED ORDER — WEGOVY 2.4 MG/0.75ML ~~LOC~~ SOAJ: 2.4000 mg | SUBCUTANEOUS | 0 refills | Status: DC

## 2024-01-14 ENCOUNTER — Other Ambulatory Visit: Payer: Self-pay

## 2024-01-14 ENCOUNTER — Telehealth: Payer: Self-pay

## 2024-01-14 NOTE — Telephone Encounter (Signed)
 Pharmacy Patient Advocate Encounter  Received notification from [facility] MEDICAID that Prior Authorization for Wegovy 2.4MG /0.75ML auto-injectors has been APPROVED from 01/14/24 to 07/15/24. Ran test claim, Copay is $4. This test claim was processed through [facility]- copay amounts may vary at other pharmacies due to pharmacy/plan contracts, or as the patient moves through the different stages of their insurance plan.   PA #/Case ID/Reference #: --

## 2024-01-22 ENCOUNTER — Other Ambulatory Visit: Payer: Self-pay | Admitting: Physician Assistant

## 2024-01-24 ENCOUNTER — Other Ambulatory Visit: Payer: Self-pay | Admitting: Physician Assistant

## 2024-01-24 DIAGNOSIS — J310 Chronic rhinitis: Secondary | ICD-10-CM

## 2024-02-01 ENCOUNTER — Encounter: Payer: Self-pay | Admitting: Physician Assistant

## 2024-02-01 MED ORDER — WEGOVY 2.4 MG/0.75ML ~~LOC~~ SOAJ: 2.4000 mg | SUBCUTANEOUS | 2 refills | Status: DC

## 2024-02-03 ENCOUNTER — Ambulatory Visit: Admitting: Physician Assistant

## 2024-02-03 ENCOUNTER — Encounter: Payer: Self-pay | Admitting: Physician Assistant

## 2024-02-03 VITALS — BP 116/70 | HR 78 | Temp 97.9°F | Ht 65.0 in | Wt 144.0 lb

## 2024-02-03 DIAGNOSIS — F411 Generalized anxiety disorder: Secondary | ICD-10-CM | POA: Diagnosis not present

## 2024-02-03 DIAGNOSIS — Z8639 Personal history of other endocrine, nutritional and metabolic disease: Secondary | ICD-10-CM | POA: Diagnosis not present
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
## 2024-02-03 NOTE — Patient Instructions (Signed)
 It was great to see you!

## 2024-03-13 ENCOUNTER — Encounter: Payer: Self-pay | Admitting: Physician Assistant

## 2024-03-13 MED ORDER — GABAPENTIN 100 MG PO CAPS
100.0000 mg | ORAL_CAPSULE | Freq: Every day | ORAL | 1 refills | Status: DC
Start: 2024-03-13 — End: 2024-03-15

## 2024-03-15 ENCOUNTER — Encounter: Payer: Self-pay | Admitting: Physician Assistant

## 2024-03-15 ENCOUNTER — Other Ambulatory Visit: Payer: Self-pay | Admitting: Physician Assistant

## 2024-03-15 MED ORDER — GABAPENTIN 100 MG PO CAPS: 100.0000 mg | ORAL_CAPSULE | Freq: Two times a day (BID) | ORAL | 1 refills | Status: DC

## 2024-03-15 NOTE — Telephone Encounter (Signed)
 Christine Cunningham, okay to change Rx for Gabapentin  to twice a day?

## 2024-03-17 ENCOUNTER — Ambulatory Visit (INDEPENDENT_AMBULATORY_CARE_PROVIDER_SITE_OTHER)

## 2024-03-17 ENCOUNTER — Encounter: Payer: Self-pay | Admitting: Podiatry

## 2024-03-17 ENCOUNTER — Ambulatory Visit (INDEPENDENT_AMBULATORY_CARE_PROVIDER_SITE_OTHER): Admitting: Podiatry

## 2024-03-17 DIAGNOSIS — M8560 Other cyst of bone, unspecified site: Secondary | ICD-10-CM | POA: Diagnosis not present

## 2024-03-17 DIAGNOSIS — M7751 Other enthesopathy of right foot: Secondary | ICD-10-CM

## 2024-03-17 NOTE — Patient Instructions (Signed)
 For topical medications you can use VOLTAREN  GEL or BIOFREEZE or ASPERCREME WITH LIDOCAINE    I have ordered a MRI of the right foot. If you do not hear for them about scheduling within the next 1 week, or you have any questions please give us  a call at [phone].

## 2024-03-18 NOTE — Progress Notes (Signed)
 Subjective: Chief Complaint  Patient presents with   Foot Pain    Rm#11 Right foot pain patient states she is in a lot of pain.Ongoing for 3 months worsening.    53 year old female presents the office above concerns.  She said the pain is coming back.  She has been on her feet quite a bit but does not report any recent injuries.  Increased swelling any redness that she reports.   Objective: AAO x3, NAD DP/PT pulses palpable bilaterally, CRT less than 3 seconds On the right foot submetatarsal is minimal hyperkeratotic lesion without any underlying ulceration, drainage or signs of infection.  No puncture wound.  No palpable neuroma.  No area pinpoint tenderness.  Most the pain is along the second MTPJ. No pain with calf compression, swelling, warmth, erythema  Assessment: Capsulitis right foot; concern for bone cyst 2nd metatarsal   Plan: -All treatment options discussed with the patient including all alternatives, risks, complications.  -Repeat x-rays obtained reviewed.  On the second metatarsal head there does appear to be possible cystic structure noted along fifth metatarsal head which is worsened and changed compared to prior x-rays. -Given x-ray findings this does correlate to where she has symptoms.  Ordered an MRI with contrast to further evaluate.  She has been dealing with this issue for greater than 1 year and she is attempted numerous conservative treatments including offloading, padding, immobilization in surgical shoe, anti-inflammatories without significant improvement. -Recommend to return to surgical shoe for now.  Discussed topical medications.  Will hold off on injections given x-ray findings today.  Return for MRI results .  Charity Conch DPM

## 2024-03-22 ENCOUNTER — Encounter: Payer: Self-pay | Admitting: Podiatry

## 2024-04-10 ENCOUNTER — Encounter: Payer: Self-pay | Admitting: Physician Assistant

## 2024-04-10 MED ORDER — LORAZEPAM 0.5 MG PO TABS: ORAL_TABLET | ORAL | 0 refills | Status: DC

## 2024-04-10 NOTE — Telephone Encounter (Signed)
 Pt requesting refill for Lorazepam  0.5 mg tablet. Last OV 02/03/2024.

## 2024-04-13 ENCOUNTER — Encounter: Payer: Self-pay | Admitting: Physician Assistant

## 2024-04-13 MED ORDER — ASPIRIN 81 MG PO TBEC: 81.0000 mg | DELAYED_RELEASE_TABLET | Freq: Every day | ORAL | 3 refills | Status: AC

## 2024-04-21 ENCOUNTER — Encounter: Payer: Self-pay | Admitting: Advanced Practice Midwife

## 2024-04-25 ENCOUNTER — Ambulatory Visit
Admission: RE | Admit: 2024-04-25 | Discharge: 2024-04-25 | Disposition: A | Discharge status: Home / Self Care | Source: Ambulatory Visit | Attending: Podiatry | Admitting: Podiatry

## 2024-04-25 ENCOUNTER — Ambulatory Visit: Payer: Self-pay

## 2024-04-25 DIAGNOSIS — M7751 Other enthesopathy of right foot: Secondary | ICD-10-CM

## 2024-04-25 DIAGNOSIS — M8560 Other cyst of bone, unspecified site: Secondary | ICD-10-CM

## 2024-04-25 MED ORDER — GADOPICLENOL 0.5 MMOL/ML IV SOLN
7.5000 mL | Freq: Once | INTRAVENOUS | Status: AC | PRN
  Administered 2024-04-25: 7.5 mL via INTRAVENOUS

## 2024-04-25 NOTE — Telephone Encounter (Signed)
 FYI Only or Action Required?: FYI only for provider.  Patient was last seen in primary care on 02/03/2024 by Job Lukes, PA.  Called Nurse Triage reporting Facial Swelling.  Symptoms began several days ago.  Interventions attempted: OTC medications: nasal spray, allergy  meds, Rest, hydration, or home remedies, and Ice/heat application.  Symptoms are: unchanged.  Triage Disposition: See Physician Within 24 Hours  Patient/caregiver understands and will follow disposition?: Yes     Copied from CRM #[id number]. Topic: Clinical - Red Word Triage >> Apr 25, 2024  4:10 PM Rosina BIRCH wrote: Reason for RMF:ozqu side of her nose is swollen and she just noticed it two days ago Reason for Disposition  Face swelling is painful to touch  Answer Assessment - Initial Assessment Questions 1. ONSET: When did the swelling start? (e.g., minutes, hours, days)     X2 days 2. LOCATION: What part of the face is swollen? (e.g., cheek, entire face, jaw joint area, under jaw)     L side of nose 3. SEVERITY: How swollen is it?     Redness, and noticeable - unable to accurately describe 4. ITCHING: Is there any itching? If Yes, ask: How much?   (Scale 1-10; mild, moderate or severe)     denies 5. PAIN: Is the swelling painful to touch? If Yes, ask: How painful is it?   (Scale 0-10; mild, moderate or severe)     Sore - tender to touch 6. FEVER: Do you have a fever? If Yes, ask: What is it, how was it measured, and when did it start?      denies 7. CAUSE: What do you think is causing the face swelling?     Possible sinus infection Endorses doing nasal spray, allergy  pill 8. NEW MEDICINES: Have there been any new medicines started recently?     denies 9. RECURRENT SYMPTOM: Have you had face swelling before? If Yes, ask: When was the last time? What happened that time?     denies 10. OTHER SYMPTOMS: Do you have any other symptoms? (e.g., leg swelling, toothache)        denies 11. PREGNANCY: Is there any chance you are pregnant? When was your last menstrual period?       N/a    Scheduled patient on the next available acute appt on April 27, 2024 with Corean, NP .  Protocols used: Face Swelling-A-AH

## 2024-04-26 NOTE — Telephone Encounter (Signed)
 Noted; pt scheduled for OV

## 2024-04-27 ENCOUNTER — Ambulatory Visit: Admitting: Family

## 2024-04-27 ENCOUNTER — Encounter: Payer: Self-pay | Admitting: Family

## 2024-04-27 VITALS — BP 113/67 | HR 70 | Temp 98.1°F | Ht 65.0 in | Wt 134.0 lb

## 2024-04-27 DIAGNOSIS — R220 Localized swelling, mass and lump, head: Secondary | ICD-10-CM

## 2024-04-27 MED ORDER — IBUPROFEN 600 MG PO TABS: 600.0000 mg | ORAL_TABLET | Freq: Three times a day (TID) | ORAL | 0 refills | Status: DC | PRN
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
## 2024-05-01 ENCOUNTER — Ambulatory Visit: Payer: Self-pay | Admitting: Podiatry

## 2024-05-02 NOTE — Telephone Encounter (Signed)
 MRI right foot Requested CD copy of MRI from [facility] imaging [id number] Requested over read from [facility] Over read Services [phone]

## 2024-05-02 NOTE — Telephone Encounter (Signed)
-----   Message from Donnice JONELLE Fees sent at 05/01/2024  6:40 PM EDT ----- Can we send the MRI for an over read? Thanks! ----- Message ----- From: Interface, Rad Results In Sent: 04/26/2024   3:06 PM EDT To: Donnice JONELLE Fees, DPM

## 2024-05-09 ENCOUNTER — Inpatient Hospital Stay: Payer: Medicaid Other

## 2024-05-09 DIAGNOSIS — R768 Other specified abnormal immunological findings in serum: Secondary | ICD-10-CM

## 2024-05-09 DIAGNOSIS — D649 Anemia, unspecified: Secondary | ICD-10-CM | POA: Diagnosis present

## 2024-05-09 LAB — CMP (CANCER CENTER ONLY)
ALT: 15 U/L (ref 0–44)
AST: 15 U/L (ref 15–41)
Albumin: 4.2 g/dL (ref 3.5–5.0)
Alkaline Phosphatase: 80 U/L (ref 38–126)
Anion gap: 4 — ABNORMAL LOW (ref 5–15)
BUN: 11 mg/dL (ref 6–20)
CO2: 28 mmol/L (ref 22–32)
Calcium: 9.3 mg/dL (ref 8.9–10.3)
Chloride: 106 mmol/L (ref 98–111)
Creatinine: 0.65 mg/dL (ref 0.44–1.00)
GFR, Estimated: >60 mL/min (ref >60)
Glucose, Bld: 77 mg/dL (ref 70–99)
Potassium: 3.7 mmol/L (ref 3.5–5.1)
Sodium: 138 mmol/L (ref 135–145)
Total Bilirubin: 0.5 mg/dL (ref 0.0–1.2)
Total Protein: 7 g/dL (ref 6.5–8.1)

## 2024-05-09 LAB — CBC WITH DIFFERENTIAL (CANCER CENTER ONLY)
Abs Immature Granulocytes: 0 K/uL (ref 0.00–0.07)
Basophils Absolute: 0 K/uL (ref 0.0–0.1)
Basophils Relative: 1 %
Eosinophils Absolute: 0.1 K/uL (ref 0.0–0.5)
Eosinophils Relative: 2 %
HCT: 34.8 % — ABNORMAL LOW (ref 36.0–46.0)
Hemoglobin: 11.5 g/dL — ABNORMAL LOW (ref 12.0–15.0)
Immature Granulocytes: 0 %
Lymphocytes Relative: 44 %
Lymphs Abs: 1.9 K/uL (ref 0.7–4.0)
MCH: 28.1 pg (ref 26.0–34.0)
MCHC: 33 g/dL (ref 30.0–36.0)
MCV: 85.1 fL (ref 80.0–100.0)
Monocytes Absolute: 0.4 K/uL (ref 0.1–1.0)
Monocytes Relative: 9 %
Neutro Abs: 1.9 K/uL (ref 1.7–7.7)
Neutrophils Relative %: 44 %
Platelet Count: 232 K/uL (ref 150–400)
RBC: 4.09 MIL/uL (ref 3.87–5.11)
RDW: 16.2 % — ABNORMAL HIGH (ref 11.5–15.5)
WBC Count: 4.3 K/uL (ref 4.0–10.5)
nRBC: 0 % (ref 0.0–0.2)

## 2024-05-09 LAB — LACTATE DEHYDROGENASE: LDH: 146 U/L (ref 98–192)

## 2024-05-09 NOTE — Telephone Encounter (Signed)
 Irma, was this sent over an over read?

## 2024-05-10 LAB — IGG, IGA, IGM
IgA: 83 mg/dL — ABNORMAL LOW (ref 87–352)
IgG (Immunoglobin G), Serum: 1098 mg/dL (ref 586–1602)
IgM (Immunoglobulin M), Srm: 144 mg/dL (ref 26–217)

## 2024-05-10 LAB — KAPPA/LAMBDA LIGHT CHAINS
Kappa free light chain: 18.2 mg/L (ref 3.3–19.4)
Kappa, lambda light chain ratio: 1.7 — ABNORMAL HIGH (ref 0.26–1.65)
Lambda free light chains: 10.7 mg/L (ref 5.7–26.3)

## 2024-05-10 NOTE — Telephone Encounter (Signed)
 I haven't because it looks like Powell had started this I have reached out to her to verify.

## 2024-05-10 NOTE — Telephone Encounter (Signed)
 Yes this has been done on 05/02/2024.

## 2024-05-12 LAB — MULTIPLE MYELOMA PANEL, SERUM
Albumin SerPl Elph-Mcnc: 3.6 g/dL (ref 2.9–4.4)
Albumin/Glob SerPl: 1.3 (ref 0.7–1.7)
Alpha 1: 0.2 g/dL (ref 0.0–0.4)
Alpha2 Glob SerPl Elph-Mcnc: 0.7 g/dL (ref 0.4–1.0)
B-Globulin SerPl Elph-Mcnc: 0.8 g/dL (ref 0.7–1.3)
Gamma Glob SerPl Elph-Mcnc: 1.2 g/dL (ref 0.4–1.8)
Globulin, Total: 2.8 g/dL (ref 2.2–3.9)
IgA: 79 mg/dL — ABNORMAL LOW (ref 87–352)
IgG (Immunoglobin G), Serum: 1204 mg/dL (ref 586–1602)
IgM (Immunoglobulin M), Srm: 139 mg/dL (ref 26–217)
Total Protein ELP: 6.4 g/dL (ref 6.0–8.5)

## 2024-05-14 ENCOUNTER — Ambulatory Visit
Admission: EM | Admit: 2024-05-14 | Discharge: 2024-05-14 | Disposition: A | Discharge status: Home / Self Care | Attending: Physician Assistant | Admitting: Physician Assistant

## 2024-05-14 ENCOUNTER — Other Ambulatory Visit: Payer: Self-pay

## 2024-05-14 DIAGNOSIS — M79652 Pain in left thigh: Secondary | ICD-10-CM | POA: Diagnosis not present

## 2024-05-14 DIAGNOSIS — M79605 Pain in left leg: Secondary | ICD-10-CM | POA: Diagnosis not present

## 2024-05-14 MED ORDER — PREDNISONE 20 MG PO TABS: ORAL_TABLET | ORAL | 0 refills | Status: DC
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
## 2024-05-14 NOTE — ED Triage Notes (Addendum)
 Pt presents with a chief complaint of left thigh pain x 1 month. States the pain is beginning to radiate down to the left knee. Currently rates overall pain a 6/10. Went to another urgent care one week ago. Received a prescription for muscle relaxant and given injection in arm with no relief in pain. No known injuries. 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 May have injured self in process, not too sure.

## 2024-05-14 NOTE — Discharge Instructions (Addendum)
 You were seen today for concerns of left thigh pain.At this time I suspect you have injured a muscle in your thigh causing your symptoms and discomfort.   I recommend the following to help with your symptoms:  Using warm compresses to the affected area (15 minutes on and at least 30 minutes off) You can also take Tylenol  and ibuprofen  for further pain relief as needed Gentle stretches and massage to the area as tolerated  I have sent in a script for Prednisone  taper to be taken in the morning with breakfast per the instructions on the container Remember that steroids can cause sleeplessness, irritability, increased hunger and elevated glucose levels so be mindful of these side effects. They should lessen as you progress to the lower doses of the taper.  If your symptoms are not improving or seem to be worsening over the next 2 weeks I recommend following up with your primary care provider or going to orthopedics for further evaluation as you may need a referral to physical therapy.

## 2024-05-15 ENCOUNTER — Ambulatory Visit: Payer: Self-pay

## 2024-05-15 ENCOUNTER — Telehealth: Payer: Self-pay

## 2024-05-15 NOTE — Telephone Encounter (Signed)
 Rescheduled appointment per the patients request to a earlier time.

## 2024-05-15 NOTE — Progress Notes (Deleted)
 [facility] OFFICE PROGRESS NOTE  Patient Care Team: Job Lukes, [place] as PCP - General (Physician Assistant)  53 y.o. female with history of sinusitis, allergic rhinitis, hysterectomy referred to [facility] for anemia.  She is on chronic aspirin .   Testing showed borderline elevated K/L.  No monoclonal protein identified.  Repeat testing shows similar results.  Hemoglobin is borderline low with microcytosis. Assessment & Plan   No orders of the defined types were placed in this encounter.    Pauletta JAYSON Chihuahua, MD  INTERVAL HISTORY: Patient returns for follow-up.  Oncology History   No history exists.     PHYSICAL EXAMINATION: ECOG PERFORMANCE STATUS: {CHL ONC ECOG [phone]}  There were no vitals filed for this visit. There were no vitals filed for this visit.  Relevant data reviewed during this visit included ***

## 2024-05-15 NOTE — Progress Notes (Signed)
 [facility] OFFICE PROGRESS NOTE  Patient Care Team: Job Lukes, [place] as PCP - General (Physician Assistant)  53 y.o. female with history of sinusitis, allergic rhinitis, hysterectomy referred to [facility] for anemia.  She is on chronic aspirin .   Testing showed borderline elevated K/L.  No monoclonal protein identified.  Repeat testing shows similar results.  Bilirubin was normal.  Hemoglobin is borderline low with microcytosis. Lower MCV and hgb and elevated RDW. Will repeat iron panel and ferritin.  Of note, on chronic aspirin  and prn ibuprofen .  Currently on prednisone  taper. Assessment & Plan Normocytic anemia Repeat ferritin and iron panel Follow-up in about 6 months  Orders Placed This Encounter  Procedures   Ferritin    Standing Status:   Future    Number of Occurrences:   1    Expiration Date:   05/16/2025   Iron and Iron Binding Capacity ([facility] only)    Standing Status:   Future    Number of Occurrences:   1    Expiration Date:   05/16/2025     Pauletta JAYSON Chihuahua, MD  INTERVAL HISTORY: Patient returns for follow-up.  No melena, bloody stool or hematuria. She is taking aspirin . She took ibuprofen  for a few pain for nose swelling. She is taking prednisone  for muscle sprain. No nausea, vomiting, heartburn, diarrhea, stomach pain.  No ice craving or PICA, and not restless leg.   PHYSICAL EXAMINATION:  Vitals:   05/16/24 0855  BP: 116/85  Pulse: 86  Resp: 17  Temp: 97.6 F (36.4 C)  SpO2: 100%   Filed Weights   05/16/24 0855  Weight: 140 lb 8 oz (63.7 kg)   GENERAL: alert, no distress and comfortable NECK: No palpable mass LYMPH:  no palpable cervical lymphadenopathy  LUNGS: clear to auscultation and percussion with normal breathing effort HEART: regular rate & rhythm  ABDOMEN: abdomen soft, non-tender and nondistended.   Relevant data reviewed during this visit included labs

## 2024-05-16 ENCOUNTER — Inpatient Hospital Stay

## 2024-05-16 ENCOUNTER — Encounter: Payer: Self-pay | Admitting: Physician Assistant

## 2024-05-16 ENCOUNTER — Ambulatory Visit: Payer: Medicaid Other

## 2024-05-16 ENCOUNTER — Inpatient Hospital Stay (HOSPITAL_BASED_OUTPATIENT_CLINIC_OR_DEPARTMENT_OTHER)

## 2024-05-16 VITALS — BP 116/85 | HR 86 | Temp 97.6°F | Resp 17 | Wt 140.5 lb

## 2024-05-16 DIAGNOSIS — D649 Anemia, unspecified: Secondary | ICD-10-CM | POA: Insufficient documentation

## 2024-05-16 LAB — IRON AND IRON BINDING CAPACITY (CC-WL,HP ONLY)
Iron: 82 ug/dL (ref 28–170)
Saturation Ratios: 28 % (ref 10.4–31.8)
TIBC: 295 ug/dL (ref 250–450)
UIBC: 213 ug/dL (ref 148–442)

## 2024-05-16 LAB — FERRITIN: Ferritin: 112 ng/mL (ref 11–307)

## 2024-05-16 NOTE — Assessment & Plan Note (Addendum)
 Repeat ferritin and iron panel Follow-up in about 6 months

## 2024-05-17 ENCOUNTER — Other Ambulatory Visit: Payer: Self-pay

## 2024-05-17 MED ORDER — WEGOVY 2.4 MG/0.75ML ~~LOC~~ SOAJ: 2.4000 mg | SUBCUTANEOUS | 2 refills | Status: DC

## 2024-05-18 ENCOUNTER — Other Ambulatory Visit (HOSPITAL_COMMUNITY): Payer: Self-pay

## 2024-05-18 ENCOUNTER — Ambulatory Visit: Payer: Self-pay

## 2024-05-18 ENCOUNTER — Ambulatory Visit: Admitting: Physician Assistant

## 2024-05-18 ENCOUNTER — Ambulatory Visit (INDEPENDENT_AMBULATORY_CARE_PROVIDER_SITE_OTHER)

## 2024-05-18 ENCOUNTER — Telehealth: Payer: Self-pay

## 2024-05-18 ENCOUNTER — Encounter: Payer: Self-pay | Admitting: Physician Assistant

## 2024-05-18 VITALS — BP 102/67 | HR 70 | Temp 97.3°F | Ht 65.0 in | Wt 141.6 lb

## 2024-05-18 DIAGNOSIS — M25552 Pain in left hip: Secondary | ICD-10-CM | POA: Diagnosis not present

## 2024-05-18 DIAGNOSIS — Z8639 Personal history of other endocrine, nutritional and metabolic disease: Secondary | ICD-10-CM | POA: Diagnosis not present

## 2024-05-18 DIAGNOSIS — M79605 Pain in left leg: Secondary | ICD-10-CM

## 2024-05-18 DIAGNOSIS — D649 Anemia, unspecified: Secondary | ICD-10-CM

## 2024-05-18 MED ORDER — WEGOVY 2.4 MG/0.75ML ~~LOC~~ SOAJ: 2.4000 mg | SUBCUTANEOUS | 2 refills | Status: DC

## 2024-05-18 NOTE — Telephone Encounter (Signed)
 Pharmacy Patient Advocate Encounter   Received notification from CoverMyMeds that prior authorization for Wegovy  2.4 is required/requested.   Insurance verification completed.   The patient is insured through [facility] .   Per test claim: PA required; PA submitted to above mentioned insurance via Latent Key/confirmation #/EOC [id number] Status is pending

## 2024-05-18 NOTE — Telephone Encounter (Signed)
 Pharmacy Patient Advocate Encounter  Received notification from [employer] that Prior Authorization for Wegovy2.4 has been DENIED.  No reason given; No denial letter received via Fax or CMM. It has been requested and will be uploaded to the media tab once received.   PA #/Case ID/Reference #: [id number]
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
## 2024-05-18 NOTE — Patient Instructions (Addendum)
 VISIT SUMMARY: During your visit, we discussed your left hip pain and recent weight loss. We also reviewed your management plan for obesity and iron deficiency anemia.  YOUR PLAN: LEFT HIP AND THIGH PAIN: Your hip pain is likely due to muscle strain from lifting. It has been improving with prednisone . -We will order an x-ray of your left hip to check for any bone issues. -Continue taking prednisone  as prescribed. -You will be referred to physical therapy for rehabilitation.  OBESITY: Your weight management with semaglutide  (Wegovy ) has been effective, but there was a slight weight gain due to missed doses during a stressful period. -We have reauthorized your Wegovy  prescription. -Please make sure to adhere to your semaglutide  regimen. -We will monitor your weight and adjust your treatment as necessary.  IRON DEFICIENCY ANEMIA: You are under the care of Dr. Donnajean Chihuahua for your anemia. Recent blood work has been done to monitor your iron levels. -Continue to monitor your iron levels as recommended by your hematologist. -Follow up with Dr. Donnajean Chihuahua for ongoing evaluation.                      Contains text generated by Abridge.                                 Contains text generated by Abridge.

## 2024-05-19 NOTE — Telephone Encounter (Signed)
 Left message to return call to our office at their convenience.   Wegovy2.4 has been DENIED you can contact your insurance for alternatives

## 2024-05-22 ENCOUNTER — Encounter: Payer: Self-pay | Admitting: Physician Assistant

## 2024-05-22 NOTE — Telephone Encounter (Signed)
 Please find out why Wegovy  was denied per provider, there is no letter. Thanks

## 2024-05-24 NOTE — Telephone Encounter (Signed)
 Its looks like the PA submitted didn't include medical documentation that pt is working on and will continue working on lifestyle changes (including structured nutrition and physical activity, unless physical activity is not clinically appropriate.

## 2024-05-25 ENCOUNTER — Telehealth: Payer: Self-pay

## 2024-05-25 ENCOUNTER — Ambulatory Visit: Payer: Self-pay | Admitting: Physician Assistant

## 2024-05-25 ENCOUNTER — Other Ambulatory Visit (HOSPITAL_COMMUNITY): Payer: Self-pay

## 2024-05-25 NOTE — Telephone Encounter (Signed)
 Please do PA for Wegovy  2.4 mg and send office notes from 05/18/2024 it is documented about pt exercising and diet.

## 2024-05-25 NOTE — Telephone Encounter (Signed)
 Message sent to PA Team to resubmit PA for Wegovy .

## 2024-05-25 NOTE — Telephone Encounter (Signed)
 Pharmacy Patient Advocate Encounter   Received notification from Pt Calls Messages that prior authorization for Wegovy  2.4MG /0.75ML auto-injectors is required/requested.   Insurance verification completed.   The patient is insured through [facility] .   Per test claim: PA required; PA submitted to above mentioned insurance via Latent Key/confirmation #/EOC [id number] Status is pending   -Resubmitted with new chart notes.

## 2024-05-26 ENCOUNTER — Encounter: Payer: Self-pay | Admitting: Physician Assistant

## 2024-05-26 MED ORDER — VENTOLIN HFA 108 (90 BASE) MCG/ACT IN AERS: 1.0000 | INHALATION_SPRAY | Freq: Four times a day (QID) | RESPIRATORY_TRACT | 2 refills | Status: DC | PRN

## 2024-05-26 NOTE — Telephone Encounter (Signed)
 Pharmacy Patient Advocate Encounter  Received notification from [employer] that Prior Authorization for Wegovy  2.4MG /0.75ML auto-injectors has been APPROVED from 05/25/24 to 05/25/25   PA #/Case ID/Reference #: [id number]

## 2024-05-26 NOTE — Telephone Encounter (Signed)
 Noted

## 2024-05-30 ENCOUNTER — Encounter: Payer: Self-pay | Admitting: Physician Assistant

## 2024-05-31 MED ORDER — GABAPENTIN 100 MG PO CAPS: 100.0000 mg | ORAL_CAPSULE | Freq: Two times a day (BID) | ORAL | 1 refills | Status: AC

## 2024-06-06 ENCOUNTER — Encounter: Payer: Self-pay | Admitting: Physician Assistant

## 2024-06-06 DIAGNOSIS — M79605 Pain in left leg: Secondary | ICD-10-CM

## 2024-06-08 ENCOUNTER — Ambulatory Visit (INDEPENDENT_AMBULATORY_CARE_PROVIDER_SITE_OTHER): Admitting: Sports Medicine

## 2024-06-08 VITALS — BP 132/74 | Ht 65.0 in | Wt 141.0 lb

## 2024-06-08 DIAGNOSIS — M7062 Trochanteric bursitis, left hip: Secondary | ICD-10-CM

## 2024-06-08 NOTE — Progress Notes (Signed)
 Christine Cunningham D.Christine Cunningham [facility] [address] [place] [address] Phone: [phone]   Assessment and Plan:     1. Greater trochanteric bursitis, left (Primary) -Acute, initial visit - Most consistent with greater trochanteric bursitis progressing to include IT band syndrome x 1 month due to physical activity and side sleeping  -Patient elected for greater trochanteric CSI.  Tolerated well per note below - Patient had moderate relief with prednisone  course, however symptoms returned after completing course.  Do not recommend repeating prednisone  course at this time - Start HEP for gluteal musculature, IT band syndrome - X-ray reviewed with patient in clinic.  My interpretation: No acute fracture or dislocation.  Procedure: Greater trochanteric bursal injection Side: Left    Risks explained and consent was given verbally. The site was cleaned with alcohol prep. A steroid injection was performed with patient in the lateral side-lying position at area of maximum tenderness over greater trochanter using 2mL of 1% lidocaine  without epinephrine and 1mL of kenalog  40mg /ml. This was well tolerated.  Needle was removed, hemostasis achieved, and post injection instructions were explained.  Pt was advised to call or return to clinic if these symptoms worsen or fail to improve as anticipated.   15 additional minutes spent for educating Therapeutic Home Exercise Program.  This included exercises focusing on stretching, strengthening, with focus on eccentric aspects.   Long term goals include an improvement in range of motion, strength, endurance as well as avoiding reinjury. Patient's frequency would include in 1-2 times a day, 3-5 times a week for a duration of 6-12 weeks. Proper technique shown and discussed handout in great detail with ATC.  All questions were discussed and answered.    Pertinent previous records reviewed include left hip x-ray 05/18/2024   Follow  Up: 4 weeks for reevaluation.  Could consider NSAID course versus physical therapy   Subjective:   I, Christine Cunningham, am serving as a Neurosurgeon for Doctor Morene Mace  Chief Complaint: left leg pain   HPI:   06/08/24 Patient is a 53 year old female with left leg pain. Patient states pain started last month. No MOI. Pain starts in the hip and radiates down to her knee. Pain is worse at night. Tylenol  and ibu dont help. Prednisone  helped with the pain. Pain came back once she finished prednisone . No numbness or tingling. Antalgic gait.    Relevant Historical Information: History of CVA  Additional pertinent review of systems negative.   Current Outpatient Medications:    Ascorbic Acid (VITAMIN C) 500 MG CAPS, Take 1 capsule by mouth daily in the afternoon., Disp: , Rfl:    aspirin  EC (ASPIRIN  LOW DOSE) 81 MG tablet, Take 1 tablet (81 mg total) by mouth daily. Swallow whole., Disp: 90 tablet, Rfl: 3   atorvastatin  (LIPITOR) 40 MG tablet, TAKE 1 TABLET(40 MG) BY MOUTH DAILY, Disp: 90 tablet, Rfl: 3   azelastine  (ASTELIN ) 0.1 % nasal spray, Place 1 spray into both nostrils 2 (two) times daily. Use in each nostril as directed, Disp: 30 mL, Rfl: 2   Calcium  Carbonate (CALCIUM  600 PO), Take 1 tablet by mouth daily., Disp: , Rfl:    cyanocobalamin  (VITAMIN B12) 1000 MCG tablet, Take 1,000 mcg by mouth daily., Disp: , Rfl:    cyclobenzaprine (FLEXERIL) 10 MG tablet, Take 10 mg by mouth., Disp: , Rfl:    fluticasone  (FLONASE ) 50 MCG/ACT nasal spray, Place 2 sprays into both nostrils 2 (two) times daily., Disp: 16 g,  Rfl: 6   furosemide  (LASIX ) 20 MG tablet, TAKE 1 TABLET(20 MG) BY MOUTH DAILY AS NEEDED FOR SWELLING, Disp: 30 tablet, Rfl: 3   gabapentin  (NEURONTIN ) 100 MG capsule, Take 1 capsule (100 mg total) by mouth 2 (two) times daily., Disp: 180 capsule, Rfl: 1   ibuprofen  (ADVIL ) 600 MG tablet, Take 1 tablet (600 mg total) by mouth every 8 (eight) hours as needed for mild pain (pain score  1-3) (Take after eating.)., Disp: 20 tablet, Rfl: 0   levocetirizine (XYZAL ) 5 MG tablet, TAKE 1 TABLET(5 MG) BY MOUTH DAILY, Disp: 30 tablet, Rfl: 5   LORazepam  (ATIVAN ) 0.5 MG tablet, TAKE 1 TABLET(0.5 MG) BY MOUTH DAILY AS NEEDED, Disp: 90 tablet, Rfl: 0   montelukast  (SINGULAIR ) 10 MG tablet, TAKE 1 TABLET(10 MG) BY MOUTH AT BEDTIME, Disp: 90 tablet, Rfl: 1   ondansetron  (ZOFRAN ) 4 MG tablet, Take 1 tablet (4 mg total) by mouth every 8 (eight) hours as needed for nausea or vomiting., Disp: 20 tablet, Rfl: 0   predniSONE  (DELTASONE ) 20 MG tablet, Take 60mg  PO daily x 2 days, then40mg  PO daily x 2 days, then 20mg  PO daily x 3 days, Disp: 13 tablet, Rfl: 0   pseudoephedrine  (SUDAFED) 60 MG tablet, Take 1 tablet (60 mg total) by mouth 2 (two) times daily as needed for congestion (Nasal congestion.)., Disp: 20 tablet, Rfl: 0   semaglutide -weight management (WEGOVY ) 2.4 MG/0.75ML SOAJ SQ injection, Inject 2.4 mg into the skin once a week., Disp: 3 mL, Rfl: 2   triamcinolone  cream (KENALOG ) 0.1 %, Apply to affected area 1-2 times daily, Disp: 30 g, Rfl: 0   VENTOLIN  HFA 108 (90 Base) MCG/ACT inhaler, Inhale 1-2 puffs into the lungs every 6 (six) hours as needed for wheezing or shortness of breath., Disp: 18 g, Rfl: 2   Objective:     Vitals:   06/08/24 1112  BP: 132/74  Weight: 141 lb (64 kg)  Height: 5' 5 (1.651 m)      Body mass index is 23.46 kg/m.    Physical Exam:    General: awake, alert, and oriented no acute distress, nontoxic Skin: no suspicious lesions or rashes Neuro:sensation intact distally with no deficits, normal muscle tone, no atrophy, strength 5/5 in all tested lower ext groups Psych: normal mood and affect, speech clear   Left hip: No deformity, swelling or wasting ROM Flexion 90, ext 30, IR 45, ER 45 TTP left greater trochanter, gluteal musculature, IT band NTTP over the hip flexors, si joint, lumbar spine Negative log roll with FROM Negative FABER Negative  FADIR Negative Piriformis test or radicular symptoms, though reproduced left gluteal strain Positive left trendelenberg Gait normal    Electronically signed by:  Odis Mace D.Christine Cunningham [facility] 11:43 AM 06/08/24

## 2024-06-08 NOTE — Patient Instructions (Signed)
Hip HEP  4 week follow up

## 2024-06-19 ENCOUNTER — Ambulatory Visit: Admitting: Sports Medicine

## 2024-07-05 NOTE — Progress Notes (Unsigned)
 [facility] [address] [place] [address] Phone: [phone]   Assessment and Plan:     1. Greater trochanteric bursitis, left (Primary) -Subacute, subsequent visit - Overall improvement in greater trochanteric bursitis, however patient continues to experience mild lateral hip pain with pain radiating along the IT band most consistent with IT band syndrome - Continue HEP and start physical therapy targeting gluteal musculature, hip, IT band - Start meloxicam  15 mg daily for 3 weeks.  Do not to use additional over-the-counter NSAIDs (ibuprofen , naproxen , Advil , Aleve , etc.) while taking prescription NSAIDs.  May use Tylenol  [phone] mg 2 to 3 times a day for breakthrough pain. -Means topical Voltaren  gel over areas of pain  Pertinent previous records reviewed include none   Follow Up: 6 to 8 weeks for reevaluation.  Could consider prednisone  course versus discussing OMT   Subjective:   I, Christine Cunningham, am serving as a Neurosurgeon for Doctor Christine Cunningham   Chief Complaint: left leg pain    HPI:    06/08/24 Patient is a 53 year old female with left leg pain. Patient states pain started last month. No MOI. Pain starts in the hip and radiates down to her knee. Pain is worse at night. Tylenol  and ibu dont help. Prednisone  helped with the pain. Pain came back once she finished prednisone . No numbness or tingling. Antalgic gait.    07/06/2024 Patient states pain came back a couple of weeks ago. Pain is radiating from the knee to the hip    Relevant Historical Information: History of CVA  Additional pertinent review of systems negative.   Current Outpatient Medications:    meloxicam  (MOBIC ) 15 MG tablet, Take 1 tablet (15 mg total) by mouth daily., Disp: 21 tablet, Rfl: 0   Ascorbic Acid (VITAMIN C) 500 MG CAPS, Take 1 capsule by mouth daily in the afternoon., Disp: , Rfl:    aspirin  EC (ASPIRIN  LOW DOSE) 81 MG tablet, Take  1 tablet (81 mg total) by mouth daily. Swallow whole., Disp: 90 tablet, Rfl: 3   atorvastatin  (LIPITOR) 40 MG tablet, TAKE 1 TABLET(40 MG) BY MOUTH DAILY, Disp: 90 tablet, Rfl: 3   azelastine  (ASTELIN ) 0.1 % nasal spray, Place 1 spray into both nostrils 2 (two) times daily. Use in each nostril as directed, Disp: 30 mL, Rfl: 2   Calcium  Carbonate (CALCIUM  600 PO), Take 1 tablet by mouth daily., Disp: , Rfl:    cyanocobalamin  (VITAMIN B12) 1000 MCG tablet, Take 1,000 mcg by mouth daily., Disp: , Rfl:    cyclobenzaprine (FLEXERIL) 10 MG tablet, Take 10 mg by mouth., Disp: , Rfl:    fluticasone  (FLONASE ) 50 MCG/ACT nasal spray, Place 2 sprays into both nostrils 2 (two) times daily., Disp: 16 g, Rfl: 6   furosemide  (LASIX ) 20 MG tablet, TAKE 1 TABLET(20 MG) BY MOUTH DAILY AS NEEDED FOR SWELLING, Disp: 30 tablet, Rfl: 3   gabapentin  (NEURONTIN ) 100 MG capsule, Take 1 capsule (100 mg total) by mouth 2 (two) times daily., Disp: 180 capsule, Rfl: 1   ibuprofen  (ADVIL ) 600 MG tablet, Take 1 tablet (600 mg total) by mouth every 8 (eight) hours as needed for mild pain (pain score 1-3) (Take after eating.)., Disp: 20 tablet, Rfl: 0   levocetirizine (XYZAL ) 5 MG tablet, TAKE 1 TABLET(5 MG) BY MOUTH DAILY, Disp: 30 tablet, Rfl: 5   LORazepam  (ATIVAN ) 0.5 MG tablet, TAKE 1 TABLET(0.5 MG) BY MOUTH DAILY AS NEEDED, Disp: 90 tablet, Rfl:  0   montelukast  (SINGULAIR ) 10 MG tablet, TAKE 1 TABLET(10 MG) BY MOUTH AT BEDTIME, Disp: 90 tablet, Rfl: 1   ondansetron  (ZOFRAN ) 4 MG tablet, Take 1 tablet (4 mg total) by mouth every 8 (eight) hours as needed for nausea or vomiting., Disp: 20 tablet, Rfl: 0   predniSONE  (DELTASONE ) 20 MG tablet, Take 60mg  PO daily x 2 days, then40mg  PO daily x 2 days, then 20mg  PO daily x 3 days, Disp: 13 tablet, Rfl: 0   pseudoephedrine  (SUDAFED) 60 MG tablet, Take 1 tablet (60 mg total) by mouth 2 (two) times daily as needed for congestion (Nasal congestion.)., Disp: 20 tablet, Rfl: 0    semaglutide -weight management (WEGOVY ) 2.4 MG/0.75ML SOAJ SQ injection, Inject 2.4 mg into the skin once a week., Disp: 3 mL, Rfl: 2   triamcinolone  cream (KENALOG ) 0.1 %, Apply to affected area 1-2 times daily, Disp: 30 g, Rfl: 0   VENTOLIN  HFA 108 (90 Base) MCG/ACT inhaler, Inhale 1-2 puffs into the lungs every 6 (six) hours as needed for wheezing or shortness of breath., Disp: 18 g, Rfl: 2   Objective:     Vitals:   07/06/24 0947  BP: 122/74  Weight: 143 lb (64.9 kg)  Height: 5' 5 (1.651 m)      Body mass index is 23.8 kg/m.    Physical Exam:    General: awake, alert, and oriented no acute distress, nontoxic Skin: no suspicious lesions or rashes Neuro:sensation intact distally with no deficits, normal muscle tone, no atrophy, strength 5/5 in all tested lower ext groups Psych: normal mood and affect, speech clear   Left hip: No deformity, swelling or wasting ROM Flexion 90, ext 30, IR 45, ER 45 TTP mildly left greater trochanter, gluteal musculature, and moderately IT band NTTP over the hip flexors, si joint, lumbar spine Negative log roll with FROM Negative FABER Negative FADIR Negative Piriformis test or radicular symptoms, though reproduced left gluteal strain Positive left trendelenberg Gait normal     Electronically signed by:  [facility] 10:02 AM 07/06/24

## 2024-07-06 ENCOUNTER — Telehealth: Payer: Self-pay | Admitting: Sports Medicine

## 2024-07-06 ENCOUNTER — Other Ambulatory Visit: Payer: Self-pay | Admitting: Sports Medicine

## 2024-07-06 ENCOUNTER — Ambulatory Visit (INDEPENDENT_AMBULATORY_CARE_PROVIDER_SITE_OTHER): Admitting: Sports Medicine

## 2024-07-06 VITALS — BP 122/74 | Ht 65.0 in | Wt 143.0 lb

## 2024-07-06 DIAGNOSIS — M7062 Trochanteric bursitis, left hip: Secondary | ICD-10-CM

## 2024-07-06 MED ORDER — MELOXICAM 15 MG PO TABS: 15.0000 mg | ORAL_TABLET | Freq: Every day | ORAL | 0 refills | Status: AC

## 2024-07-06 NOTE — Patient Instructions (Addendum)
-   Start meloxicam  15 mg daily x3 weeks.  May use remaining NSAID as needed once daily for pain control.  Do not to use additional over-the-counter NSAIDs (ibuprofen , naproxen , Advil , Aleve , etc.) while taking prescription NSAIDs.  May use Tylenol  [phone] mg 2 to 3 times a day for breakthrough pain.   PT referral   Voltaren  gel over areas of pain as needed   6-8 week follow up

## 2024-07-06 NOTE — Addendum Note (Signed)
 Addended by: MAYBELL ALSTON R on: 07/06/2024 10:57 AM   Modules accepted: Orders

## 2024-07-06 NOTE — Telephone Encounter (Signed)
 Referral changed to [address]

## 2024-07-06 NOTE — Telephone Encounter (Signed)
 This patient was referred to [facility] for PT but we do not accept the patient's insurance.

## 2024-07-10 ENCOUNTER — Encounter: Payer: Self-pay | Admitting: Physician Assistant

## 2024-07-10 MED ORDER — LORAZEPAM 0.5 MG PO TABS: ORAL_TABLET | ORAL | 0 refills | Status: DC

## 2024-07-10 NOTE — Telephone Encounter (Signed)
 Pt requesting refill for Lorazepam  0.5 mg tablet. Last  05/18/2024.

## 2024-07-11 ENCOUNTER — Other Ambulatory Visit: Payer: Self-pay | Admitting: Family Medicine

## 2024-07-12 ENCOUNTER — Ambulatory Visit: Attending: Sports Medicine

## 2024-07-12 ENCOUNTER — Other Ambulatory Visit: Payer: Self-pay

## 2024-07-12 DIAGNOSIS — M7062 Trochanteric bursitis, left hip: Secondary | ICD-10-CM | POA: Insufficient documentation

## 2024-07-12 DIAGNOSIS — M6281 Muscle weakness (generalized): Secondary | ICD-10-CM | POA: Insufficient documentation

## 2024-07-12 DIAGNOSIS — M25552 Pain in left hip: Secondary | ICD-10-CM | POA: Insufficient documentation
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
## 2024-07-13 ENCOUNTER — Telehealth: Payer: Self-pay | Admitting: *Deleted

## 2024-07-13 NOTE — Telephone Encounter (Signed)
 Copied from CRM #[id number]. Topic: Clinical - Medication Question >> Jul 13, 2024  8:06 AM Berneda FALCON wrote: Reason for CRM: Patient states that the LORazepam  (ATIVAN ) 0.5 MG tablet was not at the [facility]. Wants to check on the status of this for her.  My Chart message sent to patient.

## 2024-07-18 ENCOUNTER — Ambulatory Visit (INDEPENDENT_AMBULATORY_CARE_PROVIDER_SITE_OTHER): Admitting: *Deleted

## 2024-07-18 DIAGNOSIS — Z23 Encounter for immunization: Secondary | ICD-10-CM

## 2024-07-20 ENCOUNTER — Ambulatory Visit

## 2024-07-20 DIAGNOSIS — M25552 Pain in left hip: Secondary | ICD-10-CM | POA: Diagnosis not present

## 2024-07-20 DIAGNOSIS — M6281 Muscle weakness (generalized): Secondary | ICD-10-CM
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
## 2024-07-21 NOTE — Addendum Note (Signed)
 Addended by: ROSANN CHRISTMAS DIETER on: 07/21/2024 10:43 AM   Modules accepted: Orders

## 2024-07-25 ENCOUNTER — Ambulatory Visit
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
## 2024-07-28 ENCOUNTER — Encounter: Payer: Self-pay | Admitting: Physician Assistant

## 2024-07-28 ENCOUNTER — Ambulatory Visit: Admitting: Physician Assistant

## 2024-07-28 VITALS — BP 120/68 | HR 90 | Temp 97.4°F

## 2024-07-28 DIAGNOSIS — J320 Chronic maxillary sinusitis: Secondary | ICD-10-CM | POA: Diagnosis not present

## 2024-07-28 MED ORDER — AMOXICILLIN-POT CLAVULANATE 875-125 MG PO TABS: 1.0000 | ORAL_TABLET | Freq: Two times a day (BID) | ORAL | 0 refills | Status: AC
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
## 2024-08-01 ENCOUNTER — Ambulatory Visit

## 2024-08-01 ENCOUNTER — Other Ambulatory Visit: Payer: Self-pay | Admitting: Sports Medicine

## 2024-08-01 DIAGNOSIS — M25552 Pain in left hip: Secondary | ICD-10-CM

## 2024-08-01 DIAGNOSIS — M6281 Muscle weakness (generalized): Secondary | ICD-10-CM
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
## 2024-08-07 ENCOUNTER — Encounter: Payer: Self-pay | Admitting: Physician Assistant

## 2024-08-08 ENCOUNTER — Ambulatory Visit: Attending: Sports Medicine

## 2024-08-08 DIAGNOSIS — M6281 Muscle weakness (generalized): Secondary | ICD-10-CM | POA: Diagnosis present

## 2024-08-08 DIAGNOSIS — M25552 Pain in left hip: Secondary | ICD-10-CM | POA: Insufficient documentation
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
## 2024-08-10 ENCOUNTER — Encounter: Payer: Self-pay | Admitting: Physician Assistant

## 2024-08-10 ENCOUNTER — Ambulatory Visit

## 2024-08-10 ENCOUNTER — Telehealth: Payer: Self-pay

## 2024-08-10 ENCOUNTER — Ambulatory Visit: Admitting: Physician Assistant

## 2024-08-10 VITALS — BP 120/74 | HR 73 | Temp 97.5°F | Ht 65.0 in | Wt 142.4 lb

## 2024-08-10 DIAGNOSIS — Z8639 Personal history of other endocrine, nutritional and metabolic disease: Secondary | ICD-10-CM

## 2024-08-10 DIAGNOSIS — F411 Generalized anxiety disorder: Secondary | ICD-10-CM

## 2024-08-10 DIAGNOSIS — Z8673 Personal history of transient ischemic attack (TIA), and cerebral infarction without residual deficits: Secondary | ICD-10-CM

## 2024-08-10 DIAGNOSIS — E785 Hyperlipidemia, unspecified: Secondary | ICD-10-CM

## 2024-08-10 MED ORDER — PRAVASTATIN SODIUM 10 MG PO TABS: 10.0000 mg | ORAL_TABLET | Freq: Every day | ORAL | 1 refills | Status: AC

## 2024-08-10 MED ORDER — LORAZEPAM 0.5 MG PO TABS: ORAL_TABLET | ORAL | 0 refills | Status: DC
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
## 2024-08-10 NOTE — Telephone Encounter (Signed)
 Christine Cunningham  PT, DPT

## 2024-08-14 ENCOUNTER — Other Ambulatory Visit: Payer: Self-pay | Admitting: Sports Medicine

## 2024-08-14 ENCOUNTER — Encounter: Payer: Self-pay | Admitting: Sports Medicine

## 2024-08-14 DIAGNOSIS — M7062 Trochanteric bursitis, left hip: Secondary | ICD-10-CM

## 2024-08-14 NOTE — Telephone Encounter (Signed)
New pt referral placed.

## 2024-08-15 ENCOUNTER — Encounter: Payer: Self-pay | Admitting: Physician Assistant

## 2024-08-15 DIAGNOSIS — J310 Chronic rhinitis: Secondary | ICD-10-CM

## 2024-08-15 MED ORDER — LEVOCETIRIZINE DIHYDROCHLORIDE 5 MG PO TABS: 5.0000 mg | ORAL_TABLET | Freq: Every evening | ORAL | 5 refills | Status: AC

## 2024-08-16 NOTE — Progress Notes (Signed)
 Ben Kimmerly Lora [name] [address] [place] [address] Phone: [phone]   Assessment and Plan:     1. Greater trochanteric bursitis, left (Primary) 2. It band syndrome, left -Chronic with exacerbation, subsequent visit - Overall improvement and greater trochanteric bursitis after greater trochanteric CSI on 06/08/24, however patient continued to experience pain along left lateral thigh most consistent with IT band syndrome.  Mild to moderate improvement with as needed meloxicam  use and physical therapy -Recommend restarting a consistent course of meloxicam .  Start meloxicam  15 mg daily x2 weeks.  If still having pain after 2 weeks, complete 3rd-week of NSAID. May use remaining NSAID as needed once daily for pain control.  Do not to use additional over-the-counter NSAIDs (ibuprofen , naproxen , Advil , Aleve , etc.) while taking prescription NSAIDs.  May use Tylenol  [phone] mg 2 to 3 times a day for breakthrough pain.  -Use topical Voltaren  gel over areas of pain - Continue HEP and physical therapy  Pertinent previous records reviewed include none   Follow Up: 6-8 weeks.  Could consider prednisone  course versus discussing OMT versus advanced imaging    Subjective:   I, Moenique Parris, am serving as a neurosurgeon for Doctor Morene Mace   Chief Complaint: left leg pain    HPI:    06/08/24 Patient is a 53 year old female with left leg pain. Patient states pain started last month. No MOI. Pain starts in the hip and radiates down to her knee. Pain is worse at night. Tylenol  and ibu dont help. Prednisone  helped with the pain. Pain came back once she finished prednisone . No numbness or tingling. Antalgic gait.    07/06/2024 Patient states pain came back a couple of weeks ago. Pain is radiating from the knee to the hip   08/17/2024 Patient states PT has been helping  Relevant Historical Information: History of CVA    Additional pertinent  review of systems negative.   Current Outpatient Medications:    amoxicillin -clavulanate (AUGMENTIN ) 875-125 MG tablet, Take 1 tablet by mouth 2 (two) times daily., Disp: 20 tablet, Rfl: 0   Ascorbic Acid (VITAMIN C) 500 MG CAPS, Take 1 capsule by mouth daily in the afternoon., Disp: , Rfl:    aspirin  EC (ASPIRIN  LOW DOSE) 81 MG tablet, Take 1 tablet (81 mg total) by mouth daily. Swallow whole., Disp: 90 tablet, Rfl: 3   azelastine  (ASTELIN ) 0.1 % nasal spray, Place 1 spray into both nostrils 2 (two) times daily. Use in each nostril as directed, Disp: 30 mL, Rfl: 2   Calcium  Carbonate (CALCIUM  600 PO), Take 1 tablet by mouth daily., Disp: , Rfl:    cyanocobalamin  (VITAMIN B12) 1000 MCG tablet, Take 1,000 mcg by mouth daily., Disp: , Rfl:    fluticasone  (FLONASE ) 50 MCG/ACT nasal spray, Place 2 sprays into both nostrils 2 (two) times daily., Disp: 16 g, Rfl: 6   furosemide  (LASIX ) 20 MG tablet, TAKE 1 TABLET(20 MG) BY MOUTH DAILY AS NEEDED FOR SWELLING, Disp: 30 tablet, Rfl: 3   gabapentin  (NEURONTIN ) 100 MG capsule, Take 1 capsule (100 mg total) by mouth 2 (two) times daily., Disp: 180 capsule, Rfl: 1   levocetirizine (XYZAL ) 5 MG tablet, Take 1 tablet (5 mg total) by mouth every evening., Disp: 30 tablet, Rfl: 5   LORazepam  (ATIVAN ) 0.5 MG tablet, TAKE 1 TABLET(0.5 MG) BY MOUTH DAILY AS NEEDED, Disp: 90 tablet, Rfl: 0   meloxicam  (MOBIC ) 15 MG tablet, Take 1 tablet (15 mg total) by  mouth daily., Disp: 21 tablet, Rfl: 0   montelukast  (SINGULAIR ) 10 MG tablet, TAKE 1 TABLET(10 MG) BY MOUTH AT BEDTIME, Disp: 90 tablet, Rfl: 1   ondansetron  (ZOFRAN ) 4 MG tablet, Take 1 tablet (4 mg total) by mouth every 8 (eight) hours as needed for nausea or vomiting., Disp: 20 tablet, Rfl: 0   pravastatin (PRAVACHOL) 10 MG tablet, Take 1 tablet (10 mg total) by mouth daily., Disp: 90 tablet, Rfl: 1   pseudoephedrine  (SUDAFED) 60 MG tablet, Take 1 tablet (60 mg total) by mouth 2 (two) times daily as needed for  congestion (Nasal congestion.)., Disp: 20 tablet, Rfl: 0   semaglutide -weight management (WEGOVY ) 2.4 MG/0.75ML SOAJ SQ injection, Inject 2.4 mg into the skin once a week. (Patient not taking: Reported on 08/10/2024), Disp: 3 mL, Rfl: 2   triamcinolone  cream (KENALOG ) 0.1 %, Apply to affected area 1-2 times daily, Disp: 30 g, Rfl: 0   VENTOLIN  HFA 108 (90 Base) MCG/ACT inhaler, Inhale 1-2 puffs into the lungs every 6 (six) hours as needed for wheezing or shortness of breath., Disp: 18 g, Rfl: 2   Objective:     Vitals:   08/17/24 0931  BP: 120/74  Pulse: 89  SpO2: 100%  Weight: 145 lb (65.8 kg)  Height: 5' 5 (1.651 m)      Body mass index is 24.13 kg/m.    Physical Exam:    General: awake, alert, and oriented no acute distress, nontoxic Skin: no suspicious lesions or rashes Neuro:sensation intact distally with no deficits, normal muscle tone, no atrophy, strength 5/5 in all tested lower ext groups Psych: normal mood and affect, speech clear   Left hip: No deformity, swelling or wasting ROM Flexion 90, ext 30, IR 45, ER 45 TTP mildly left greater trochanter, gluteal musculature, and moderately IT band NTTP over the hip flexors, si joint, lumbar spine Negative log roll with FROM Negative FABER Negative FADIR Negative Piriformis test or radicular symptoms, though reproduced left gluteal strain Positive left trendelenberg Gait normal    Electronically signed by:  Odis Mace [name] 9:46 AM 08/17/24

## 2024-08-17 ENCOUNTER — Ambulatory Visit (INDEPENDENT_AMBULATORY_CARE_PROVIDER_SITE_OTHER): Admitting: Sports Medicine

## 2024-08-17 VITALS — BP 120/74 | HR 89 | Ht 65.0 in | Wt 145.0 lb

## 2024-08-17 DIAGNOSIS — M7062 Trochanteric bursitis, left hip: Secondary | ICD-10-CM | POA: Diagnosis not present

## 2024-08-17 DIAGNOSIS — M7632 Iliotibial band syndrome, left leg: Secondary | ICD-10-CM

## 2024-08-17 MED ORDER — MELOXICAM 15 MG PO TABS: ORAL_TABLET | ORAL | 0 refills | Status: AC

## 2024-08-17 NOTE — Patient Instructions (Signed)
-   Start meloxicam  15 mg daily x2 weeks.  If still having pain after 2 weeks, complete 3rd-week of NSAID. May use remaining NSAID as needed once daily for pain control.  Do not to use additional over-the-counter NSAIDs (ibuprofen , naproxen , Advil , Aleve , etc.) while taking prescription NSAIDs.  May use Tylenol  [phone] mg 2 to 3 times a day for breakthrough pain.  Refill meloxicam    Voltaren  gel over areas of pain   Continue HEP and PT   6-8 week follow up

## 2024-08-21 ENCOUNTER — Ambulatory Visit (INDEPENDENT_AMBULATORY_CARE_PROVIDER_SITE_OTHER)

## 2024-08-21 VITALS — BP 114/79 | HR 79 | Temp 97.9°F | Wt 145.0 lb

## 2024-08-21 DIAGNOSIS — R0981 Nasal congestion: Secondary | ICD-10-CM

## 2024-08-21 DIAGNOSIS — J343 Hypertrophy of nasal turbinates: Secondary | ICD-10-CM

## 2024-08-21 DIAGNOSIS — Z87891 Personal history of nicotine dependence: Secondary | ICD-10-CM | POA: Diagnosis not present

## 2024-08-21 DIAGNOSIS — J329 Chronic sinusitis, unspecified: Secondary | ICD-10-CM

## 2024-08-21 DIAGNOSIS — J342 Deviated nasal septum: Secondary | ICD-10-CM
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
## 2024-08-28 ENCOUNTER — Ambulatory Visit

## 2024-08-28 DIAGNOSIS — M6281 Muscle weakness (generalized): Secondary | ICD-10-CM | POA: Diagnosis not present

## 2024-08-28 DIAGNOSIS — M25552 Pain in left hip: Secondary | ICD-10-CM
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
## 2024-09-05 ENCOUNTER — Ambulatory Visit: Payer: Self-pay

## 2024-09-05 NOTE — Telephone Encounter (Signed)
    Copied from CRM #[id number]. Topic: Clinical - Medical Advice >> Sep 05, 2024 10:51 AM Montie POUR wrote: Reason for CRM:  Notes from Urgent Care on 08/21/24 She had ingrown fingernail/hangnail earlier this week, she was messing with the hangnail and then yesterday it started to hurt and swell. No drainage, pain and slightly warm. Soaked it in epsom salt last night. No meds taken. Her wound is getting better, not worse, but it is still swollen. She wants to know if she needs to follow up with her doctor to make sure everything is okay. Please call her at [phone] to discuss.

## 2024-09-05 NOTE — Telephone Encounter (Signed)
 FYI Only or Action Required?: FYI only for provider: Home Care.  Patient was last seen in primary care on 08/10/2024 by Job Lukes, PA.  Called Nurse Triage reporting Wound Check.  Symptoms began several weeks ago.  Interventions attempted: OTC medications: ointment and Rest, hydration, or home remedies (epsom salt soaks), bandage.  Symptoms are: gradually improving.  Triage Disposition: Home Care  Patient/caregiver understands and will follow disposition?: Yes      1. MECHANISM: How did the injury happen?      Was picking at area, went to Urgent Care on 08/21/24 when occurred.  2. ONSET: When did the injury happen? (e.g., minutes, hours ago)      Several weeks ago.   3. LOCATION: What part of the finger is injured? Is the nail damaged?      Per chart, right second digit.  Patient reports nail did not fall off and base of nail intact.  Reports appears to be healing but is uncertain of yellow scabbing and mild swelling.  4. APPEARANCE of the INJURY: What does the injury look like?      Denies any pain, redness, warmth, or drainage.  Area at tip of finger is scabbed, mild swelling.  Denies numbness/tingling, and denies bruising.   5. SEVERITY: Can you use the hand normally?  Can you bend your fingers into a ball and then fully open them?     Yes  6. SIZE: For cuts, bruises, or swelling, ask: How large is it? (e.g., inches or centimeters;  entire finger)      Skin intact, no open areas.   7. PAIN: Is there pain? If Yes, ask: How bad is the pain?  (Scale 0-10; or none, mild, moderate, severe)     Denies  8. TETANUS: For any breaks in the skin, ask: When was your last tetanus booster?     Patient reports when seen at urgent care tetanus shot was not necessary.   9. OTHER SYMPTOMS: Do you have any other symptoms?     Denies.  Reports was messing with nail which possibly re-irritated.  Was using epsom salt soaks and ointment at home as directed by  urgent care.  Had been covering with guaze and coban, changing dressings twice daily.  Denies any fever or chills.  Reports feeling overall that area is healing but was uncertain if a follow-up would be necessary.     Reason for Disposition  Torn fingernail  Additional Information  Negative: [1] No prior tetanus shots (or is not fully vaccinated) AND [2] any wound (e.g., cut, scrape)    Seen in urgent care  Protocols used: Finger Injury-A-AH

## 2024-09-05 NOTE — Telephone Encounter (Signed)
 2nd attempt, no answer. Left voicemail for patient to return call for nurse triage.

## 2024-09-05 NOTE — Telephone Encounter (Signed)
 Appointment made today with PCP for tomorrow.

## 2024-09-06 ENCOUNTER — Ambulatory Visit: Admitting: Physician Assistant

## 2024-09-06 ENCOUNTER — Ambulatory Visit

## 2024-09-06 ENCOUNTER — Encounter: Payer: Self-pay | Admitting: Physician Assistant

## 2024-09-06 VITALS — BP 110/70 | HR 82 | Temp 98.0°F | Ht 65.0 in | Wt 152.4 lb

## 2024-09-06 DIAGNOSIS — M79644 Pain in right finger(s): Secondary | ICD-10-CM | POA: Diagnosis not present

## 2024-09-06 MED ORDER — DOXYCYCLINE HYCLATE 100 MG PO TABS: 100.0000 mg | ORAL_TABLET | Freq: Two times a day (BID) | ORAL | 0 refills | Status: AC

## 2024-09-07 ENCOUNTER — Ambulatory Visit: Payer: Self-pay | Admitting: Physician Assistant

## 2024-09-07 ENCOUNTER — Telehealth: Payer: Self-pay

## 2024-09-07 NOTE — Telephone Encounter (Signed)
 Patient returned call. Patient states understaing of xray results. informed her the # number to hand surgery was under message in her MyChart.

## 2024-09-07 NOTE — Progress Notes (Signed)
Left VM for patient to call back for results.
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
## 2024-09-10 ENCOUNTER — Other Ambulatory Visit: Payer: Self-pay | Admitting: Physician Assistant

## 2024-09-19 ENCOUNTER — Ambulatory Visit: Admitting: Orthopedic Surgery

## 2024-09-19 DIAGNOSIS — L03011 Cellulitis of right finger: Secondary | ICD-10-CM
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
## 2024-09-26 ENCOUNTER — Other Ambulatory Visit: Payer: Self-pay | Admitting: Physician Assistant

## 2024-09-26 DIAGNOSIS — Z1231 Encounter for screening mammogram for malignant neoplasm of breast: Secondary | ICD-10-CM

## 2024-10-03 NOTE — Progress Notes (Signed)
 "               Christine Cunningham [facility] [address] [place] [address] Phone: [phone]   Assessment and Plan:     1. Greater trochanteric bursitis, left (Primary) 2. It band syndrome, left -Chronic with exacerbation, subsequent visit - Overall significant improvement in lateral leg pain consistent with resolving greater trochanteric bursitis and IT band syndrome after greater trochanteric CSI on 9//25, completing meloxicam  course, completing prednisone  course. - May continue to use Voltaren  gel topically over areas of pain - Continue HEP for gluteal musculature and add HEP for IT band  3. Bilateral plantar fasciitis -Chronic with exacerbation, initial visit - History of intermittent bilateral foot pain consistent with plantar fasciitis.  Right plantar fasciitis has improved after completing prednisone  course, however new flare of left plantar fasciitis over the past 1 to 2 weeks - Start HEP for plantar fasciitis - May use topical Voltaren  gel over areas of pain - May use inserts for plantar fasciitis to prevent recurrence  15 additional minutes spent for educating Therapeutic Home Exercise Program.  This included exercises focusing on stretching, strengthening, with focus on eccentric aspects.   Long term goals include an improvement in range of motion, strength, endurance as well as avoiding reinjury. Patient's frequency would include in 1-2 times a day, 3-5 times a week for a duration of 6-12 weeks. Proper technique shown and discussed handout in great detail with ATC.  All questions were discussed and answered.      Pertinent previous records reviewed include none   Follow Up: As needed if no improvement or worsening of symptoms.  Could consider repeat NSAID versus prednisone  course.  Could discuss physical therapy versus CSI   Subjective:   I, Christine Cunningham, am serving as a neurosurgeon for Doctor Morene Mace   Chief Complaint: left leg pain     HPI:    06/08/24 Patient is a 53 year old female with left leg pain. Patient states pain started last month. No MOI. Pain starts in the hip and radiates down to her knee. Pain is worse at night. Tylenol  and ibu dont help. Prednisone  helped with the pain. Pain came back once she finished prednisone . No numbness or tingling. Antalgic gait.    07/06/2024 Patient states pain came back a couple of weeks ago. Pain is radiating from the knee to the hip    08/17/2024 Patient states PT has been helping  10/09/2024 Patient states she feels good.    Relevant Historical Information: History of CVA  Additional pertinent review of systems negative.  Current Medications[1]   Objective:     Vitals:   10/09/24 0949  BP: 120/78  Pulse: 63  SpO2: 97%  Weight: 155 lb (70.3 kg)  Height: 5' 5 (1.651 m)      Body mass index is 25.79 kg/m.    Physical Exam:    General: awake, alert, and oriented no acute distress, nontoxic Skin: no suspicious lesions or rashes Neuro:sensation intact distally with no deficits, normal muscle tone, no atrophy, strength 5/5 in all tested lower ext groups Psych: normal mood and affect, speech clear   Left hip: No deformity, swelling or wasting ROM Flexion 90, ext 30, IR 45, ER 45 TTP mildly left  IT band NTTP over the hip flexors, si joint, lumbar spine, greater trochanter, gluteal musculature  Negative log roll with FROM Negative FABER Negative FADIR Negative Piriformis test or radicular symptoms, though reproduced left gluteal  strain Negative left trendelenberg Gait normal   Left foot: TTP medial plantar surface of calcaneus, medial plantar fascial band  Electronically signed by:  Christine Cunningham [facility] 10:33 AM 10/09/2024     [1]  Current Outpatient Medications:    amoxicillin -clavulanate (AUGMENTIN ) 875-125 MG tablet, Take 1 tablet by mouth 2 (two) times daily. (Patient not taking: Reported on 09/06/2024), Disp: 20 tablet,  Rfl: 0   Ascorbic Acid (VITAMIN C) 500 MG CAPS, Take 1 capsule by mouth daily in the afternoon., Disp: , Rfl:    aspirin  EC (ASPIRIN  LOW DOSE) 81 MG tablet, Take 1 tablet (81 mg total) by mouth daily. Swallow whole., Disp: 90 tablet, Rfl: 3   azelastine  (ASTELIN ) 0.1 % nasal spray, Place 1 spray into both nostrils 2 (two) times daily. Use in each nostril as directed, Disp: 30 mL, Rfl: 2   Calcium  Carbonate (CALCIUM  600 PO), Take 1 tablet by mouth daily., Disp: , Rfl:    cyanocobalamin  (VITAMIN B12) 1000 MCG tablet, Take 1,000 mcg by mouth daily., Disp: , Rfl:    doxycycline  (VIBRA -TABS) 100 MG tablet, Take 1 tablet (100 mg total) by mouth 2 (two) times daily., Disp: 14 tablet, Rfl: 0   fluticasone  (FLONASE ) 50 MCG/ACT nasal spray, Place 2 sprays into both nostrils 2 (two) times daily., Disp: 16 g, Rfl: 6   furosemide  (LASIX ) 20 MG tablet, TAKE 1 TABLET(20 MG) BY MOUTH DAILY AS NEEDED FOR SWELLING, Disp: 30 tablet, Rfl: 3   gabapentin  (NEURONTIN ) 100 MG capsule, Take 1 capsule (100 mg total) by mouth 2 (two) times daily., Disp: 180 capsule, Rfl: 1   levocetirizine (XYZAL ) 5 MG tablet, Take 1 tablet (5 mg total) by mouth every evening., Disp: 30 tablet, Rfl: 5   LORazepam  (ATIVAN ) 0.5 MG tablet, TAKE 1 TABLET(0.5 MG) BY MOUTH DAILY AS NEEDED, Disp: 90 tablet, Rfl: 0   meloxicam  (MOBIC ) 15 MG tablet, Take 1 tablet (15 mg total) by mouth daily., Disp: 21 tablet, Rfl: 0   meloxicam  (MOBIC ) 15 MG tablet, Take 1 tablet daily for 2 weeks.  If still in pain after 2 weeks, take 1 tablet daily for an additional 1 week., Disp: 30 tablet, Rfl: 0   montelukast  (SINGULAIR ) 10 MG tablet, TAKE 1 TABLET(10 MG) BY MOUTH AT BEDTIME, Disp: 90 tablet, Rfl: 1   ondansetron  (ZOFRAN ) 4 MG tablet, Take 1 tablet (4 mg total) by mouth every 8 (eight) hours as needed for nausea or vomiting., Disp: 20 tablet, Rfl: 0   pravastatin  (PRAVACHOL ) 10 MG tablet, Take 1 tablet (10 mg total) by mouth daily., Disp: 90 tablet, Rfl: 1    pseudoephedrine  (SUDAFED) 60 MG tablet, Take 1 tablet (60 mg total) by mouth 2 (two) times daily as needed for congestion (Nasal congestion.)., Disp: 20 tablet, Rfl: 0   semaglutide -weight management (WEGOVY ) 2.4 MG/0.75ML SOAJ SQ injection, Inject 2.4 mg into the skin once a week., Disp: 3 mL, Rfl: 2   triamcinolone  cream (KENALOG ) 0.1 %, Apply to affected area 1-2 times daily, Disp: 30 g, Rfl: 0   VENTOLIN  HFA 108 (90 Base) MCG/ACT inhaler, INHALE 1 TO 2 PUFFS INTO THE LUNGS EVERY 6 HOURS AS NEEDED FOR WHEEZING OR SHORTNESS OF BREATH, Disp: 18 g, Rfl: 2  "

## 2024-10-09 ENCOUNTER — Encounter: Payer: Self-pay | Admitting: Physician Assistant

## 2024-10-09 ENCOUNTER — Ambulatory Visit (INDEPENDENT_AMBULATORY_CARE_PROVIDER_SITE_OTHER): Admitting: Sports Medicine

## 2024-10-09 VITALS — BP 120/78 | HR 63 | Ht 65.0 in | Wt 155.0 lb

## 2024-10-09 DIAGNOSIS — M7062 Trochanteric bursitis, left hip: Secondary | ICD-10-CM | POA: Diagnosis not present

## 2024-10-09 DIAGNOSIS — M7632 Iliotibial band syndrome, left leg: Secondary | ICD-10-CM

## 2024-10-09 DIAGNOSIS — M722 Plantar fascial fibromatosis: Secondary | ICD-10-CM

## 2024-10-09 MED ORDER — WEGOVY 2.4 MG/0.75ML ~~LOC~~ SOAJ: 2.4000 mg | SUBCUTANEOUS | 2 refills | Status: AC

## 2024-10-09 NOTE — Patient Instructions (Addendum)
 Foot HEP   Voltaren  gel over areas of pain   Can go to [employer] or similar to get plantar fascitis inserts  As needed follow up

## 2024-10-11 ENCOUNTER — Telehealth: Payer: Self-pay

## 2024-10-11 NOTE — Telephone Encounter (Signed)
 Pharmacy Patient Advocate Encounter   Received notification from [employer]base CMM KEY that prior authorization for Wegovy  2.4MG /0.75ML auto-injectors is required/requested.   Insurance verification completed.   The patient is insured through [facility] MEDICAID.   Per test claim: PA required; PA submitted to above mentioned insurance via Latent Key/confirmation #/EOC [id number] Status is pending

## 2024-10-11 NOTE — Telephone Encounter (Signed)
 Pharmacy Patient Advocate Encounter  Received notification from [facility] MEDICAID that Prior Authorization for Wegovy  2.4MG /0.75ML auto-injectors has been DENIED.  Full denial letter will be uploaded to the media tab. See denial reason below.   PA #/Case ID/Reference #: [id number]

## 2024-10-13 NOTE — Telephone Encounter (Signed)
 Left message on voicemail I will send a My Chart message.

## 2024-10-25 ENCOUNTER — Telehealth: Payer: Self-pay

## 2024-10-25 NOTE — Telephone Encounter (Signed)
 Called patient to reschedule her appt on 2/9. Spoke with the patient and moved her to the following week, she is aware.

## 2024-10-27 ENCOUNTER — Ambulatory Visit: Admitting: Podiatry

## 2024-10-30 ENCOUNTER — Ambulatory Visit

## 2024-11-01 ENCOUNTER — Encounter: Payer: Self-pay | Admitting: Physician Assistant

## 2024-11-01 MED ORDER — LORAZEPAM 0.5 MG PO TABS: ORAL_TABLET | ORAL | 0 refills | Status: DC

## 2024-11-01 MED ORDER — LORAZEPAM 0.5 MG PO TABS: ORAL_TABLET | ORAL | 0 refills | Status: AC

## 2024-11-01 NOTE — Telephone Encounter (Signed)
 Prescription request sent

## 2024-11-01 NOTE — Addendum Note (Signed)
 Addended by: CRISTOPHER TAWNI BROCKS on: 11/01/2024 08:36 AM   Modules accepted: Orders

## 2024-11-02 ENCOUNTER — Ambulatory Visit

## 2024-11-07 ENCOUNTER — Encounter: Payer: Self-pay | Admitting: Physician Assistant

## 2024-11-07 MED ORDER — ONDANSETRON HCL 4 MG PO TABS: 4.0000 mg | ORAL_TABLET | Freq: Three times a day (TID) | ORAL | 0 refills | Status: AC | PRN

## 2024-11-09 ENCOUNTER — Inpatient Hospital Stay: Admission: RE | Admit: 2024-11-09 | Discharge: 2024-11-09 | Attending: Physician Assistant

## 2024-11-09 DIAGNOSIS — Z1231 Encounter for screening mammogram for malignant neoplasm of breast: Secondary | ICD-10-CM

## 2024-11-10 ENCOUNTER — Encounter: Payer: Self-pay | Admitting: Physician Assistant

## 2024-11-10 MED ORDER — MONTELUKAST SODIUM 10 MG PO TABS: 10.0000 mg | ORAL_TABLET | Freq: Every day | ORAL | 1 refills | Status: AC

## 2024-11-13 ENCOUNTER — Other Ambulatory Visit

## 2024-11-13 ENCOUNTER — Ambulatory Visit

## 2024-11-20 ENCOUNTER — Inpatient Hospital Stay

## 2024-11-28 ENCOUNTER — Encounter: Admitting: Physician Assistant
